# Patient Record
Sex: Male | Born: 1935
Health system: Southern US, Community
[De-identification: ages and names within clinical notes are randomized; demographics above are authoritative.]

## PROBLEM LIST (undated history)

## (undated) DIAGNOSIS — R339 Retention of urine, unspecified: Secondary | ICD-10-CM

## (undated) DIAGNOSIS — N289 Disorder of kidney and ureter, unspecified: Secondary | ICD-10-CM

## (undated) HISTORY — DX: Retention of urine, unspecified: R33.9

---

## 2002-01-28 HISTORY — PX: COLONOSCOPY: SHX5424

## 2011-07-06 ENCOUNTER — Emergency Department: Payer: Self-pay | Admitting: *Deleted

## 2011-07-06 DIAGNOSIS — S61209A Unspecified open wound of unspecified finger without damage to nail, initial encounter: Secondary | ICD-10-CM | POA: Diagnosis not present

## 2011-07-16 ENCOUNTER — Emergency Department: Payer: Self-pay | Admitting: Emergency Medicine

## 2011-12-17 DIAGNOSIS — Z23 Encounter for immunization: Secondary | ICD-10-CM | POA: Diagnosis not present

## 2011-12-31 DIAGNOSIS — L259 Unspecified contact dermatitis, unspecified cause: Secondary | ICD-10-CM | POA: Diagnosis not present

## 2011-12-31 DIAGNOSIS — L280 Lichen simplex chronicus: Secondary | ICD-10-CM | POA: Diagnosis not present

## 2012-02-11 DIAGNOSIS — L738 Other specified follicular disorders: Secondary | ICD-10-CM | POA: Diagnosis not present

## 2012-02-11 DIAGNOSIS — L259 Unspecified contact dermatitis, unspecified cause: Secondary | ICD-10-CM | POA: Diagnosis not present

## 2012-02-11 DIAGNOSIS — L821 Other seborrheic keratosis: Secondary | ICD-10-CM | POA: Diagnosis not present

## 2012-05-19 DIAGNOSIS — M533 Sacrococcygeal disorders, not elsewhere classified: Secondary | ICD-10-CM | POA: Diagnosis not present

## 2012-05-19 DIAGNOSIS — H6120 Impacted cerumen, unspecified ear: Secondary | ICD-10-CM | POA: Diagnosis not present

## 2012-10-27 DIAGNOSIS — Z23 Encounter for immunization: Secondary | ICD-10-CM | POA: Diagnosis not present

## 2014-10-16 ENCOUNTER — Emergency Department
Admission: EM | Admit: 2014-10-16 | Discharge: 2014-10-16 | Disposition: A | Discharge status: Home / Self Care | Payer: Medicare Other | Attending: Emergency Medicine | Admitting: Emergency Medicine

## 2014-10-16 ENCOUNTER — Encounter: Payer: Self-pay | Admitting: *Deleted

## 2014-10-16 DIAGNOSIS — Z720 Tobacco use: Secondary | ICD-10-CM | POA: Diagnosis not present

## 2014-10-16 DIAGNOSIS — R339 Retention of urine, unspecified: Secondary | ICD-10-CM | POA: Diagnosis not present

## 2014-10-16 LAB — URINALYSIS COMPLETE WITH MICROSCOPIC (ARMC ONLY)
Bacteria, UA: NONE SEEN
Bilirubin Urine: NEGATIVE
Glucose, UA: NEGATIVE mg/dL
KETONES UR: NEGATIVE mg/dL
LEUKOCYTES UA: NEGATIVE
Nitrite: NEGATIVE
PH: 5 (ref 5.0–8.0)
PROTEIN: NEGATIVE mg/dL
SPECIFIC GRAVITY, URINE: 1.004 — AB (ref 1.005–1.030)
SQUAMOUS EPITHELIAL / LPF: NONE SEEN

## 2014-10-16 NOTE — Discharge Instructions (Signed)
Please seek medical attention for any high fevers, chest pain, shortness of breath, change in behavior, persistent vomiting, bloody stool or any other new or concerning symptoms. ° °Acute Urinary Retention °Acute urinary retention is the temporary inability to urinate. °This is a common problem in older men. As men age their prostates become larger and block the flow of urine from the bladder. This is usually a problem that has come on gradually.  °HOME CARE INSTRUCTIONS °If you are sent home with a Foley catheter and a drainage system, you will need to discuss the best course of action with your health care provider. While the catheter is in, maintain a good intake of fluids. Keep the drainage bag emptied and lower than your catheter. This is so that contaminated urine will not flow back into your bladder, which could lead to a urinary tract infection. °There are two main types of drainage bags. One is a large bag that usually is used at night. It has a good capacity that will allow you to sleep through the night without having to empty it. The second type is called a leg bag. It has a smaller capacity, so it needs to be emptied more frequently. However, the main advantage is that it can be attached by a leg strap and can go underneath your clothing, allowing you the freedom to move about or leave your home. °Only take over-the-counter or prescription medicines for pain, discomfort, or fever as directed by your health care provider.  °SEEK MEDICAL CARE IF: °· You develop a low-grade fever. °· You experience spasms or leakage of urine with the spasms. °SEEK IMMEDIATE MEDICAL CARE IF:  °· You develop chills or fever. °· Your catheter stops draining urine. °· Your catheter falls out. °· You start to develop increased bleeding that does not respond to rest and increased fluid intake. °MAKE SURE YOU: °· Understand these instructions. °· Will watch your condition. °· Will get help right away if you are not doing well or  get worse. °Document Released: 04/22/2000 Document Revised: 01/19/2013 Document Reviewed: 06/25/2012 °ExitCare® Patient Information ©2015 [employer]. This information is not intended to replace advice given to you by your health care provider. Make sure you discuss any questions you have with your health care provider. ° °

## 2014-10-16 NOTE — ED Provider Notes (Signed)
[facility] Emergency Department Provider Note   ____________________________________________  Time seen: 1830  I have reviewed the triage vital signs and the nursing notes.   HISTORY  Chief Complaint Urinary Retention   History limited by:  Language barrier   HPI Norman Waters is a 81 y.o. male  Who presents to the emergency department today with complaints of urinary retention. The patient states he was able to urinate a very small amount this morning. he has since developed lower abdominal swelling and pain. He denies any fevers, nausea or vomiting. He states he has had urinary retention in the past. He is unclear the etiology of the retention however states they told him there was a blockage. He is on a medication for his bladder that was prescribed in [place], he is unclear of its name.    History reviewed. No pertinent past medical history.  There are no active problems to display for this patient.   History reviewed. No pertinent past surgical history.  No current outpatient prescriptions on file.  Allergies Review of patient's allergies indicates no known allergies.  History reviewed. No pertinent family history.  Social History Social History  Substance Use Topics  . Smoking status: Current Every Day Smoker  . Smokeless tobacco: None  . Alcohol Use: None    Review of Systems  Constitutional: Negative for fever. Cardiovascular: Negative for chest pain. Respiratory: Negative for shortness of breath. Gastrointestinal: positive for lower abdominal pain Genitourinary:  Positive for urinary retention Musculoskeletal: Negative for back pain. Skin: Negative for rash. Neurological: Negative for headaches, focal weakness or numbness.   10-point ROS otherwise negative.  ____________________________________________   PHYSICAL EXAM:  VITAL SIGNS: ED Triage Vitals  Enc Vitals Group     BP 10/16/14 1755 185/73 mmHg     Pulse Rate  10/16/14 1755 90     Resp 10/16/14 1755 20     Temp 10/16/14 1755 97.8 F (36.6 C)     Temp Source 10/16/14 1755 Oral     SpO2 10/16/14 1755 97 %     Weight 10/16/14 1755 150 lb (68.04 kg)     Height 10/16/14 1755  (1.727 m)     Head Cir --      Peak Flow --      Pain Score 10/16/14 1756 9   Constitutional: Alert and oriented. Well appearing and in no distress. Eyes: Conjunctivae are normal. PERRL. Normal extraocular movements. ENT   Head: Normocephalic and atraumatic.   Nose: No congestion/rhinnorhea.   Mouth/Throat: Mucous membranes are moist.   Neck: No stridor. Hematological/Lymphatic/Immunilogical: No cervical lymphadenopathy. Cardiovascular: Normal rate, regular rhythm.  No murmurs, rubs, or gallops. Respiratory: Normal respiratory effort without tachypnea nor retractions. Breath sounds are clear and equal bilaterally. No wheezes/rales/rhonchi. Gastrointestinal: Soft.  Distended in the lower abdomen. Tender. Genitourinary: Deferred Musculoskeletal: Normal range of motion in all extremities. No joint effusions.  No lower extremity tenderness nor edema. Neurologic:  Normal speech and language. No gross focal neurologic deficits are appreciated. Speech is normal.  Skin:  Skin is warm, dry and intact. No rash noted. Psychiatric: Mood and affect are normal. Speech and behavior are normal. Patient exhibits appropriate insight and judgment.  ____________________________________________    LABS (pertinent positives/negatives)  Labs Reviewed  URINALYSIS COMPLETEWITH MICROSCOPIC ([facility] ONLY) - Abnormal; Notable for the following:    Color, Urine STRAW (*)    APPearance CLEAR (*)    Specific Gravity, Urine 1.004 (*)    Hgb urine dipstick 1+ (*)  All other components within normal limits     ____________________________________________   EKG  None  ____________________________________________     RADIOLOGY  None  ____________________________________________   PROCEDURES  Procedure(s) performed: None  Critical Care performed: No  ____________________________________________   INITIAL IMPRESSION / ASSESSMENT AND PLAN / ED COURSE  Pertinent labs & imaging results that were available during my care of the patient were reviewed by me and considered in my medical decision making (see chart for details).   patient presented to the emergency department today with concerns for urinary retention and abdominal pain. Patient did have bladder scan of 852 mL. His physical exam was consistent with some bladder distention. A urinary catheter was placed with good urine output and relief of pain. I discussed with patient and family necessity of following up with urology. Urine was tested is not showing any signs of infection currently.  ____________________________________________   FINAL CLINICAL IMPRESSION(S) / ED DIAGNOSES  Final diagnoses:  Urinary retention     Phineas Semen, MD 10/16/14 2021

## 2014-10-16 NOTE — ED Notes (Signed)
Bladder scan 852

## 2014-10-16 NOTE — ED Notes (Signed)
Pt speaks mandarin, grandson at bedside to translate, states urinary retention and lower abd pain, states last time he urinated was this AM, hx of urinary retention

## 2014-10-18 ENCOUNTER — Encounter: Payer: Self-pay | Admitting: Obstetrics and Gynecology

## 2014-10-18 ENCOUNTER — Ambulatory Visit (INDEPENDENT_AMBULATORY_CARE_PROVIDER_SITE_OTHER): Payer: Medicare Other | Admitting: Obstetrics and Gynecology

## 2014-10-18 VITALS — BP 118/69 | HR 69 | Ht 69.75 in | Wt 159.6 lb

## 2014-10-18 DIAGNOSIS — R339 Retention of urine, unspecified: Secondary | ICD-10-CM

## 2014-10-18 NOTE — Progress Notes (Signed)
10/18/2014 5:19 PM   Epic Norman Waters 1931-08-11 [id number]  Referring Norman Waters: No referring [name] defined for this encounter.  Chief Complaint  Patient presents with  . Urinary Retention    new pt, [facility] ER referral x 2 days ago. Pt son states this happen before he was seen in the hospital [place] x ago in [place] country and put on Rapaflo x .     HPI: Patient is an 81 year old [place] male presenting today with his son for follow-up after being seen in the emergency department 2 days ago for acute urinary retention.  A Foley catheter was placed in the emergency department with 900 mL's residual. Patient does not speak [place] and son is providing history.   He states that patient does have a history of urinary retention and was treated in [place] with Rapaflo 4 mg twice daily. He was recently told to reduce his dosage by half due to GI upset a few weeks ago. He states that he has been taking this medication for over a year now and it was well tolerated until recently. His urinary symptoms were significantly improved while taking medication. Son states that at one time patient's physician in [place] was considering a surgery but that he was told recently that he no longer needed it. Since urinary retention episode patient has started taking his Rapaflo twice daily again and is tolerating it well/  Patient's urinary complaints include difficulty urinating, urgency and nocturia 3-4 times per night. No dysuria or gross hematuria.  No family history of prostate cancers.  Smoking history light smoker 10-15 years.  PCP: Bari Edward, MD    PMH: No past medical history on file.  Surgical History: Past Surgical History  Procedure Laterality Date  . No past surgeries      Home Medications:    Medication List       This list is accurate as of: 10/18/14  5:19 PM.  Always use your most recent med list.               silodosin 4 MG Caps capsule  Commonly known as:   RAPAFLO  Take 4 mg by mouth daily with breakfast.        Allergies: No Known Allergies  Family History: Family History  Problem Relation Age of Onset  . Prostate cancer Neg Hx   . Bladder Cancer Neg Hx   . Kidney cancer Neg Hx     Social History:  reports that he has been smoking Cigarettes.  He has been smoking about 0.25 packs per day. He does not have any smokeless tobacco history on file. He reports that he does not drink alcohol or use illicit drugs.  ROS: UROLOGY Frequent Urination?: No Hard to postpone urination?: Yes Burning/pain with urination?: No Get up at night to urinate?: Yes Leakage of urine?: No Urine stream starts and stops?: No Trouble starting stream?: No Do you have to strain to urinate?: No Blood in urine?: No Urinary tract infection?: No Sexually transmitted disease?: No Injury to kidneys or bladder?: No Weak stream?: No Erection problems?: No Penile pain?: No  Gastrointestinal Nausea?: Yes Vomiting?: No Indigestion/heartburn?: No Diarrhea?: No Constipation?: No  Constitutional Fever: No Night sweats?: No Weight loss?: No Fatigue?: No  Skin Skin rash/lesions?: No Itching?: Yes  Eyes Blurred vision?: No Double vision?: No  Ears/Nose/Throat Sore throat?: No Sinus problems?: No  Hematologic/Lymphatic Swollen glands?: No Easy bruising?: No  Cardiovascular Leg swelling?: No Chest pain?: No  Respiratory Cough?: Yes  Shortness of breath?: No  Endocrine Excessive thirst?: No  Musculoskeletal Back pain?: No Joint pain?: No  Neurological Headaches?: No Dizziness?: No  Psychologic Depression?: No Anxiety?: No  Physical Exam: BP 118/69 mmHg  Pulse 69  Ht 5' 9.75" (1.772 m)  Wt 159 lb 9.6 oz (72.394 kg)  BMI 23.06 kg/m2  Constitutional:  Alert and oriented, No acute distress. HEENT: [place] AT, moist mucus membranes.  Trachea midline, no masses. Cardiovascular: No clubbing, cyanosis, or edema. Respiratory: Normal  respiratory effort, no increased work of breathing. GI: Abdomen is soft, nontender, nondistended, no abdominal masses GU: No CVA tenderness.  Skin: No rashes, bruises or suspicious lesions. Lymph: No cervical or inguinal adenopathy. Neurologic: Grossly intact, no focal deficits, moving all 4 extremities. Psychiatric: Normal mood and affect.  Laboratory Data: No results found for: WBC, HGB, HCT, MCV, PLT  No results found for: CREATININE  No results found for: PSA  No results found for: TESTOSTERONE  No results found for: HGBA1C  Urinalysis    Component Value Date/Time   COLORURINE STRAW* 10/16/2014 1918   APPEARANCEUR CLEAR* 10/16/2014 1918   LABSPEC 1.004* 10/16/2014 1918   PHURINE 5.0 10/16/2014 1918   GLUCOSEU NEGATIVE 10/16/2014 1918   HGBUR 1+* 10/16/2014 1918   BILIRUBINUR NEGATIVE 10/16/2014 1918   KETONESUR NEGATIVE 10/16/2014 1918   PROTEINUR NEGATIVE 10/16/2014 1918   NITRITE NEGATIVE 10/16/2014 1918   LEUKOCYTESUR NEGATIVE 10/16/2014 1918    Pertinent Imaging:   Assessment & Plan: A 81 year old male with acute urinary retention most likely related to BPH. Previous healthcare obtained in [place].   1. Urinary retention- Episode of urinary retention most likely due BPH and patient reduction in Rapaflo dosage. We'll plan to leave Foley catheter in place for 1 week and have patient continue Rapaflo and return for voiding trial. It is unclear what type of surgery previous healthcare providers were considering for patient. After speaking to patient's son I feel that they may have been recommending a prostate biopsy vs bladder outlet procedure? DRE and PSA to be drawn at next visit. We also may need to consider starting finasteride in addition to Rapaflo. I have requested that son obtain medical records if available.  Return in about 1 week (around 10/25/2014) for voiding trial; DRE; need mandarin interpreter at visits if possible.  [name], FNP  [facility] [address], [address] [place], [place] [address] [phone]

## 2014-10-21 ENCOUNTER — Encounter: Payer: Self-pay | Admitting: Internal Medicine

## 2014-10-25 ENCOUNTER — Ambulatory Visit
Admission: RE | Admit: 2014-10-25 | Discharge: 2014-10-25 | Disposition: A | Discharge status: Home / Self Care | Payer: Medicare Other | Source: Ambulatory Visit | Attending: Internal Medicine | Admitting: Internal Medicine

## 2014-10-25 ENCOUNTER — Encounter: Payer: Self-pay | Admitting: Obstetrics and Gynecology

## 2014-10-25 ENCOUNTER — Ambulatory Visit (INDEPENDENT_AMBULATORY_CARE_PROVIDER_SITE_OTHER): Payer: Medicare Other | Admitting: Internal Medicine

## 2014-10-25 ENCOUNTER — Encounter: Payer: Self-pay | Admitting: Internal Medicine

## 2014-10-25 ENCOUNTER — Ambulatory Visit (INDEPENDENT_AMBULATORY_CARE_PROVIDER_SITE_OTHER): Payer: Medicare Other | Admitting: Obstetrics and Gynecology

## 2014-10-25 VITALS — BP 124/64 | HR 66 | Ht 69.75 in | Wt 163.2 lb

## 2014-10-25 VITALS — BP 122/62 | HR 64 | Ht 69.75 in | Wt 161.0 lb

## 2014-10-25 DIAGNOSIS — I700 Atherosclerosis of aorta: Secondary | ICD-10-CM | POA: Insufficient documentation

## 2014-10-25 DIAGNOSIS — N400 Enlarged prostate without lower urinary tract symptoms: Secondary | ICD-10-CM | POA: Diagnosis not present

## 2014-10-25 DIAGNOSIS — R0602 Shortness of breath: Secondary | ICD-10-CM | POA: Insufficient documentation

## 2014-10-25 DIAGNOSIS — Z720 Tobacco use: Secondary | ICD-10-CM

## 2014-10-25 DIAGNOSIS — R918 Other nonspecific abnormal finding of lung field: Secondary | ICD-10-CM | POA: Diagnosis not present

## 2014-10-25 DIAGNOSIS — N401 Enlarged prostate with lower urinary tract symptoms: Secondary | ICD-10-CM

## 2014-10-25 DIAGNOSIS — R338 Other retention of urine: Secondary | ICD-10-CM

## 2014-10-25 DIAGNOSIS — N403 Nodular prostate with lower urinary tract symptoms: Secondary | ICD-10-CM | POA: Diagnosis not present

## 2014-10-25 DIAGNOSIS — R05 Cough: Secondary | ICD-10-CM | POA: Diagnosis not present

## 2014-10-25 DIAGNOSIS — F17210 Nicotine dependence, cigarettes, uncomplicated: Secondary | ICD-10-CM | POA: Insufficient documentation

## 2014-10-25 DIAGNOSIS — F17200 Nicotine dependence, unspecified, uncomplicated: Secondary | ICD-10-CM

## 2014-10-25 DIAGNOSIS — R339 Retention of urine, unspecified: Secondary | ICD-10-CM

## 2014-10-25 DIAGNOSIS — N138 Other obstructive and reflux uropathy: Secondary | ICD-10-CM | POA: Insufficient documentation

## 2014-10-25 MED ORDER — FINASTERIDE 5 MG PO TABS: 5.0000 mg | ORAL_TABLET | Freq: Every day | ORAL | Status: DC

## 2014-10-25 NOTE — Progress Notes (Signed)
Date:  10/25/2014   Name:  Norman Waters   DOB:  1931/07/17   MRN:  [id number] Norman Waters is an 81 year old [place] gentleman who speaks primarily Mandarin [place] and very little [place]. Telephone interpreter services are used with a reference ID [id number]. His son is present in the room during the exam.  Chief Complaint: No chief complaint on file. Shortness of Breath This is a new problem. The current episode started more than 1 month ago. The problem occurs daily. The problem has been unchanged. Pertinent negatives include no chest pain, headaches, leg pain, leg swelling or wheezing. The symptoms are aggravated by exercise. Risk factors include smoking. He has tried nothing for the symptoms.   complains of a dry cough. He denies wheezing or chest pains. No fever chills or change in appetite. He continues to smoke cigarettes but believes that he could quit if he tried.   Review of Systems:  Review of Systems  Constitutional: Negative for chills and appetite change.  Respiratory: Positive for cough and shortness of breath. Negative for choking, chest tightness and wheezing.   Cardiovascular: Negative for chest pain and leg swelling.  Genitourinary: Positive for difficulty urinating (recently seen by urology and started on a new medication).  Musculoskeletal: Negative for gait problem.  Neurological: Negative for dizziness, weakness and headaches.    Patient Active Problem List   Diagnosis Date Noted  . BPH with obstruction/lower urinary tract symptoms 10/25/2014  . Tobacco use disorder 10/25/2014    Prior to Admission medications   Medication Sig Start Date End Date Taking? Authorizing Provider  finasteride (PROSCAR) 5 MG tablet Take 1 tablet (5 mg total) by mouth daily. 10/25/14  Yes Fernanda Drum, FNP  silodosin (RAPAFLO) 4 MG CAPS capsule Take 4 mg by mouth daily with breakfast.   Yes Historical Provider, MD    No Known Allergies  Past Surgical History  Procedure Laterality Date   . No past surgeries    . Colonoscopy  2004    Social History  Substance Use Topics  . Smoking status: Current Every Day Smoker -- 0.25 packs/day    Types: Cigarettes  . Smokeless tobacco: None  . Alcohol Use: No     Medication list has been reviewed and updated.  Physical Examination:  Physical Exam  Constitutional: He is oriented to person, place, and time. He appears well-developed. No distress.  HENT:  Head: Normocephalic and atraumatic.  Eyes: Conjunctivae are normal. Right eye exhibits no discharge. Left eye exhibits no discharge. No scleral icterus.  Neck: Normal range of motion. Neck supple. No thyromegaly present.  Cardiovascular: Normal rate, regular rhythm and normal heart sounds.   Pulmonary/Chest: Effort normal. No respiratory distress. He has decreased breath sounds in the right lower field and the left lower field. He has no wheezes. He has no rhonchi.  Abdominal: Soft. There is no tenderness.  Musculoskeletal: Normal range of motion.  Neurological: He is alert and oriented to person, place, and time.  Skin: Skin is warm and dry. No rash noted.  Psychiatric: He has a normal mood and affect. His speech is normal and behavior is normal.  Nursing note and vitals reviewed.   BP 122/62 mmHg  Pulse 64  Ht 5' 9.75" (1.772 m)  Wt 161 lb (73.029 kg)  BMI 23.26 kg/m2  Assessment and Plan: 1. Tobacco use disorder Recommend the patient quit smoking if at all possible Review of chest x-ray shows some apical pleural thickening but no info infiltrate or COPD  we will repeat chest x-ray in 6 months to document stability - DG Chest 2 View; Future  2. Shortness of breath  begin Anoro  1 inhalation daily sample given Call for prescription if helpful - DG Chest 2 View; Future   Bari Edward, MD [facility]  10/25/2014

## 2014-10-25 NOTE — Progress Notes (Signed)
10/25/2014 10:51 AM   Norman Waters 24-Oct-1931 [id number]  Referring provider: No referring provider defined for this encounter.  Chief Complaint  Patient presents with  . Urinary Retention    1 week f/u     HPI: Patient presents today for voiding trial after acute urinary retention episode 1 week ago and DRE. He reports he has been feeling well without complaints.  Previous Complaint History: Patient is an 81 year old [place] male presenting today with his son for follow-up after being seen in the emergency department 2 days ago for acute urinary retention. A Foley catheter was placed in the emergency department with 900 mL's residual. Patient does not speak [place] and son is providing history.   He states that patient does have a history of urinary retention and was treated in [place] with Rapaflo 4 mg twice daily. He was recently told to reduce his dosage by half due to GI upset a few weeks ago. He states that he has been taking this medication for over a year now and it was well tolerated until recently. His urinary symptoms were significantly improved while taking medication. Son states that at one time patient's physician in [place] was considering a surgery but that he was told recently that he no longer needed it. Since urinary retention episode patient has started taking his Rapaflo twice daily again and is tolerating it well.  Patient's urinary complaints include difficulty urinating, urgency and nocturia 3-4 times per night. No dysuria or gross hematuria.  No family history of prostate cancers. Smoking history light smoker 10-15 years.  PCP: Norman Edward, MD    PMH: Past Medical History  Diagnosis Date  . Urinary retention     Surgical History: Past Surgical History  Procedure Laterality Date  . No past surgeries    . Colonoscopy  2004    Home Medications:    Medication List       This list is accurate as of: 10/25/14 10:51 AM.  Always use your most recent  med list.               finasteride 5 MG tablet  Commonly known as:  PROSCAR  Take 1 tablet (5 mg total) by mouth daily.     silodosin 4 MG Caps capsule  Commonly known as:  RAPAFLO  Take 4 mg by mouth daily with breakfast.        Allergies: No Known Allergies  Family History: Family History  Problem Relation Age of Onset  . Prostate cancer Neg Hx   . Bladder Cancer Neg Hx   . Kidney cancer Neg Hx     Social History:  reports that he has been smoking Cigarettes.  He has been smoking about 0.25 packs per day. He does not have any smokeless tobacco history on file. He reports that he does not drink alcohol or use illicit drugs.  ROS: UROLOGY Frequent Urination?: No Hard to postpone urination?: No Burning/pain with urination?: No Get up at night to urinate?: No Leakage of urine?: No Urine stream starts and stops?: No Trouble starting stream?: No Do you have to strain to urinate?: No Blood in urine?: No Urinary tract infection?: No Sexually transmitted disease?: No Injury to kidneys or bladder?: No Painful intercourse?: No Weak stream?: No Erection problems?: No Penile pain?: No  Gastrointestinal Nausea?: No Vomiting?: No Indigestion/heartburn?: No Diarrhea?: No Constipation?: No  Constitutional Fever: No Night sweats?: No Weight loss?: No Fatigue?: No  Skin Skin rash/lesions?: No Itching?: No  Eyes  Blurred vision?: No Double vision?: No  Ears/Nose/Throat Sore throat?: No Sinus problems?: No  Hematologic/Lymphatic Swollen glands?: No Easy bruising?: No  Cardiovascular Leg swelling?: No Chest pain?: No  Respiratory Cough?: No Shortness of breath?: Yes  Endocrine Excessive thirst?: No  Musculoskeletal Back pain?: No Joint pain?: No  Neurological Headaches?: No Dizziness?: No  Psychologic Depression?: No Anxiety?: No  Physical Exam: BP 124/64 mmHg  Pulse 66  Ht 5' 9.75" (1.772 m)  Wt 163 lb 3.2 oz (74.027 kg)  BMI 23.58  kg/m2  Constitutional:  Alert and oriented, No acute distress. HEENT: Norman Waters AT, moist mucus membranes.  Trachea midline, no masses. Cardiovascular: No clubbing, cyanosis, or edema. Respiratory: Normal respiratory effort, no increased work of breathing. DRE: good rectal tone, very enlarged prostate +4 size, small soft mobile nodule palpable mid prostate Skin: No rashes, bruises or suspicious lesions. Lymph: No cervical or inguinal adenopathy. Neurologic: Grossly intact, no focal deficits, moving all 4 extremities. Psychiatric: Normal mood and affect.  Laboratory Data: No results found for: WBC, HGB, HCT, MCV, PLT  No results found for: CREATININE  No results found for: PSA  No results found for: TESTOSTERONE  No results found for: HGBA1C  Urinalysis    Component Value Date/Time   COLORURINE STRAW* 10/16/2014 1918   APPEARANCEUR CLEAR* 10/16/2014 1918   LABSPEC 1.004* 10/16/2014 1918   PHURINE 5.0 10/16/2014 1918   GLUCOSEU NEGATIVE 10/16/2014 1918   HGBUR 1+* 10/16/2014 1918   BILIRUBINUR NEGATIVE 10/16/2014 1918   KETONESUR NEGATIVE 10/16/2014 1918   PROTEINUR NEGATIVE 10/16/2014 1918   NITRITE NEGATIVE 10/16/2014 1918   LEUKOCYTESUR NEGATIVE 10/16/2014 1918    Pertinent Imaging:   Assessment & Plan: A 81 year old male with acute urinary retention related to BPH. Previous healthcare obtained in [place].   1. Urinary retention- Successful voiding trial today. Will add finasteride to Rapaflo to reduce chances of recurrent retention in the future.  2. BPH with LUTS-  Patient with a significantly enlarged prostate on DRE with a small soft mobile nodule mid prostate.  I discussed patient case with Dr. Berneice Heinrich who also performed DRE.  Given patient's age and health status we recommended follow up in 6 months for further monitoring of prostate nodule with DRE. Per Dr. Berneice Heinrich patient patient does not need PSA screening.  There are no diagnoses linked to this encounter.  Return in  about 6 months (around 04/24/2015) for DRE/PVR recheck urinary symptoms.  [name], FNP  [facility] [address] [place], [place] [address] [phone]

## 2014-10-25 NOTE — Progress Notes (Signed)
Fill and Pull Catheter Removal  Patient is present today for a catheter removal.  Patient was cleaned and prepped in a sterile fashion 210 ml of sterile water/ saline was instilled into the bladder when the patient felt the urge to urinate. 9 ml of water was then drained from the balloon.  A 16 FR foley cath was removed from the bladder no complications were noted .  Patient as then given some time to void on their own.  Patient can void  200 ml on their own after some time.  Patient tolerated well.  Preformed by: Natividad Brood, CMA

## 2014-11-01 DIAGNOSIS — Z23 Encounter for immunization: Secondary | ICD-10-CM | POA: Diagnosis not present

## 2014-11-08 ENCOUNTER — Other Ambulatory Visit: Payer: Self-pay | Admitting: Internal Medicine

## 2014-11-08 MED ORDER — UMECLIDINIUM-VILANTEROL 62.5-25 MCG/INH IN AEPB: 1.0000 | INHALATION_SPRAY | Freq: Every day | RESPIRATORY_TRACT | Status: DC

## 2014-11-15 ENCOUNTER — Other Ambulatory Visit: Payer: Self-pay | Admitting: Internal Medicine

## 2014-11-15 ENCOUNTER — Telehealth: Payer: Self-pay

## 2014-11-15 NOTE — Telephone Encounter (Signed)
done

## 2014-11-21 ENCOUNTER — Other Ambulatory Visit: Payer: Self-pay | Admitting: Internal Medicine

## 2014-11-21 DIAGNOSIS — J449 Chronic obstructive pulmonary disease, unspecified: Secondary | ICD-10-CM

## 2014-11-21 MED ORDER — SALMETEROL XINAFOATE 50 MCG/DOSE IN AEPB: 1.0000 | INHALATION_SPRAY | Freq: Every day | RESPIRATORY_TRACT | Status: DC

## 2014-11-21 MED ORDER — TIOTROPIUM BROMIDE MONOHYDRATE 18 MCG IN CAPS: 18.0000 ug | ORAL_CAPSULE | Freq: Every day | RESPIRATORY_TRACT | Status: DC

## 2014-12-26 ENCOUNTER — Encounter: Payer: Self-pay | Admitting: Obstetrics and Gynecology

## 2014-12-26 ENCOUNTER — Ambulatory Visit (INDEPENDENT_AMBULATORY_CARE_PROVIDER_SITE_OTHER): Payer: Medicare Other | Admitting: Obstetrics and Gynecology

## 2014-12-26 VITALS — BP 126/73 | HR 79 | Resp 18 | Ht 68.0 in | Wt 160.6 lb

## 2014-12-26 DIAGNOSIS — R339 Retention of urine, unspecified: Secondary | ICD-10-CM

## 2014-12-26 LAB — BLADDER SCAN AMB NON-IMAGING: Scan Result: 107

## 2014-12-26 MED ORDER — SILODOSIN 4 MG PO CAPS: 4.0000 mg | ORAL_CAPSULE | Freq: Every day | ORAL | Status: DC

## 2014-12-26 NOTE — Progress Notes (Signed)
12/26/2014 9:00 AM   Norman Waters Norman Waters, Norman Waters [id number]  Referring provider: Reubin MilanLaura H Berglund, MD [address] [place], [place] [id number]  Chief Complaint  Patient presents with  . Urinary Retention    f/u    HPI: Patient is a 81 year old male with a history of COPD, BPH and urinary retention presenting today with his son. He is currently managed with a master eyes and Rapaflo. He reports no urinary issues. He denies frequency, hesitancy or sensation of incomplete bladder emptying. He was previously obtaining his [place] from [place] and presents today requesting a refill sent to his local pharmacy.  PMH: Past Medical History  Diagnosis Date  . Urinary retention     Surgical History: Past Surgical History  Procedure Laterality Date  . No past surgeries    . Colonoscopy  2004    Home Medications:    Medication List       This list is accurate as of: 12/26/14  9:00 AM.  Always use your most recent med list.               finasteride 5 MG tablet  Commonly known as:  PROSCAR  Take 1 tablet (5 mg total) by mouth daily.     salmeterol 50 MCG/DOSE diskus inhaler  Commonly known as:  SEREVENT  Inhale 1 puff into the lungs daily.     silodosin 4 MG Caps capsule  Commonly known as:  RAPAFLO  Take 4 mg by mouth daily with breakfast.     tiotropium 18 MCG inhalation capsule  Commonly known as:  SPIRIVA  Place 1 capsule (18 mcg total) into inhaler and inhale daily.        Allergies: No Known Allergies  Family History: Family History  Problem Relation Age of Onset  . Prostate cancer Neg Hx   . Bladder Cancer Neg Hx   . Kidney cancer Neg Hx     Social History:  reports that he has been smoking Cigarettes.  He has been smoking about 0.Waters packs per day. He does not have any smokeless tobacco history on file. He reports that he does not drink alcohol or use illicit drugs.  ROS: UROLOGY Frequent Urination?: No Hard to postpone urination?: No Burning/pain  with urination?: No Get up at night to urinate?: No Leakage of urine?: No Urine stream starts and stops?: No Trouble starting stream?: No Do you have to strain to urinate?: No Blood in urine?: No Urinary tract infection?: No Sexually transmitted disease?: No Injury to kidneys or bladder?: No Painful intercourse?: No Weak stream?: No Erection problems?: No Penile pain?: No  Gastrointestinal Nausea?: No Vomiting?: No Indigestion/heartburn?: No Diarrhea?: No Constipation?: No  Constitutional Fever: No Night sweats?: No Weight loss?: No Fatigue?: No  Skin Skin rash/lesions?: No Itching?: No  Eyes Blurred vision?: No Double vision?: No  Ears/Nose/Throat Sore throat?: No Sinus problems?: No  Hematologic/Lymphatic Swollen glands?: No Easy bruising?: No  Cardiovascular Leg swelling?: No Chest pain?: No  Respiratory Cough?: No Shortness of breath?: No  Endocrine Excessive thirst?: No  Musculoskeletal Back pain?: No Joint pain?: No  Neurological Headaches?: No Dizziness?: No  Psychologic Depression?: No Anxiety?: No  Physical Exam: BP 126/73 mmHg  Pulse 79  Resp 18  Ht 5\' 8"  (1.727 m)  Wt 160 lb 9.6 oz (72.848 kg)  BMI 24.42 kg/m2  Constitutional:  Alert and oriented, No acute distress. HEENT: [place] AT, moist mucus membranes.  Trachea midline, no masses. Cardiovascular: No clubbing, cyanosis, or edema.  Respiratory: Normal respiratory effort, no increased work of breathing. Skin: No rashes, bruises or suspicious lesions. Lymph: No cervical or inguinal adenopathy. Neurologic: Grossly intact, no focal deficits, moving all 4 extremities. Psychiatric: Normal mood and affect.  Laboratory Data:   Urinalysis Results for orders placed or performed in visit on 12/26/14  BLADDER SCAN AMB NON-IMAGING  Result Value Ref Range   Scan Result 107 mL     Pertinent Imaging:   Assessment & Plan:    1. BPH with LUTS- PVR . Refilled Rapaflo today.   Patient also to continue finasteride and in follow-up as scheduled. - BLADDER SCAN AMB NON-IMAGING   No Follow-up on file.  These notes generated with voice recognition software. I apologize for typographical errors.  [name], FNP  [facility] [address], [address] [place], [place] [address] [phone]

## 2015-04-13 ENCOUNTER — Telehealth: Payer: Self-pay | Admitting: Obstetrics and Gynecology

## 2015-04-13 DIAGNOSIS — N400 Enlarged prostate without lower urinary tract symptoms: Secondary | ICD-10-CM

## 2015-04-13 NOTE — Telephone Encounter (Signed)
Pt's son came in and said Lillia AbedLindsay had him taking 1 capsule per day with breakfast of Rapaflo 4 mg capsules.  He had 3 refills 90 per refill.  Pt is out of meds.  He has been taking 2 per day.  His dr. In [place] told him to take twice per day.  He said he told Lillia AbedLindsay this.  Can you get any refills?  He is completely out.

## 2015-04-14 MED ORDER — SILODOSIN 8 MG PO CAPS: 8.0000 mg | ORAL_CAPSULE | Freq: Every day | ORAL | Status: DC

## 2015-04-14 NOTE — Telephone Encounter (Signed)
Per Dr. Sherryl BartersBudzyn pt could have samples and refills. Pt was given 8mg  and told to take once a day. Son voiced understanding.

## 2015-04-25 ENCOUNTER — Ambulatory Visit (INDEPENDENT_AMBULATORY_CARE_PROVIDER_SITE_OTHER): Payer: Medicare Other | Admitting: Urology

## 2015-04-25 ENCOUNTER — Encounter: Payer: Self-pay | Admitting: Urology

## 2015-04-25 VITALS — BP 128/67 | HR 71 | Ht 68.0 in | Wt 158.6 lb

## 2015-04-25 DIAGNOSIS — N401 Enlarged prostate with lower urinary tract symptoms: Secondary | ICD-10-CM | POA: Diagnosis not present

## 2015-04-25 DIAGNOSIS — R339 Retention of urine, unspecified: Secondary | ICD-10-CM | POA: Diagnosis not present

## 2015-04-25 DIAGNOSIS — N138 Other obstructive and reflux uropathy: Secondary | ICD-10-CM

## 2015-04-25 LAB — BLADDER SCAN AMB NON-IMAGING: SCAN RESULT: 90

## 2015-04-25 MED ORDER — FINASTERIDE 5 MG PO TABS: 5.0000 mg | ORAL_TABLET | Freq: Every day | ORAL | Status: DC

## 2015-04-25 MED ORDER — TAMSULOSIN HCL 0.4 MG PO CAPS
ORAL_CAPSULE | ORAL | Status: DC
Start: 2015-04-25 — End: 2016-04-23

## 2015-04-25 NOTE — Progress Notes (Signed)
10:36 AM   Norman Waters 11-01-1931 [id number]  Referring provider: Reubin Milan, MD [address] [place], [place] [address]  Chief Complaint  Patient presents with  . Benign Prostatic Hypertrophy    6 month follow up  . Urinary Retention    HPI: Patient is an 82 year old Asian male who presents today for 6 month follow-up for BPH with L UTS and history of urinary retention which is currently managed with Rapaflo 8 mg daily and finasteride 5 mg daily  BPH WITH LUTS His IPSS score today is 3, which is mild lower urinary tract symptomatology. He is pleased with his quality life due to his urinary symptoms.  His PVR is 90 mL.   His major complaint today the Rapaflo is too strong.   He denies any dysuria, hematuria or suprapubic pain.   He currently taking Rapaflo 8 mg daily and finasteride 5 mg daily.  He also denies any recent fevers, chills, nausea or vomiting.  He does not have a family history of PCa.      IPSS      04/25/15 1000       International Prostate Symptom Score   How often have you had the sensation of not emptying your bladder? Not at All     How often have you had to urinate less than every two hours? Not at All     How often have you found you stopped and started again several times when you urinated? Not at All     How often have you found it difficult to postpone urination? Not at All     How often have you had a weak urinary stream? Not at All     How often have you had to strain to start urination? Not at All     How many times did you typically get up at night to urinate? 3 Times     Total IPSS Score 3     Quality of Life due to urinary symptoms   If you were to spend the rest of your life with your urinary condition just the way it is now how would you feel about that? Pleased        Score:  1-7 Mild 8-19 Moderate 20-35 Severe  Urinary retention Patient was seen at [facility] emergency room  with an episode of acute  urinary retention in September 2016.  A Foley catheter was placed with 500 mL residual.  He has since had a voiding trial and has been voiding well.  His PVR today is 90 mL.   PMH: Past Medical History  Diagnosis Date  . Urinary retention     Surgical History: Past Surgical History  Procedure Laterality Date  . Colonoscopy  2004    Home Medications:    Medication List       This list is accurate as of: 04/25/15 10:36 AM.  Always use your most recent med list.               finasteride 5 MG tablet  Commonly known as:  PROSCAR  Take 1 tablet (5 mg total) by mouth daily.     salmeterol 50 MCG/DOSE diskus inhaler  Commonly known as:  SEREVENT  Inhale 1 puff into the lungs daily.     tamsulosin 0.4 MG Caps capsule  Commonly known as:  FLOMAX  Take one capsule in the morning and one in the evening     tiotropium 18 MCG  inhalation capsule  Commonly known as:  SPIRIVA  Place 1 capsule (18 mcg total) into inhaler and inhale daily.        Allergies: No Known Allergies  Family History: Family History  Problem Relation Age of Onset  . Prostate cancer Neg Hx   . Bladder Cancer Neg Hx   . Kidney cancer Neg Hx     Social History:  reports that he has been smoking Cigarettes.  He has been smoking about 0.25 packs per day. He does not have any smokeless tobacco history on file. He reports that he does not drink alcohol or use illicit drugs.  ROS: UROLOGY Frequent Urination?: No Hard to postpone urination?: No Burning/pain with urination?: No Get up at night to urinate?: No Leakage of urine?: No Urine stream starts and stops?: No Trouble starting stream?: No Do you have to strain to urinate?: No Blood in urine?: No Urinary tract infection?: No Sexually transmitted disease?: No Injury to kidneys or bladder?: No Painful intercourse?: No Weak stream?: No Erection problems?: No Penile pain?: No  Gastrointestinal Nausea?: No Vomiting?: No Indigestion/heartburn?:  No Diarrhea?: No Constipation?: No  Constitutional Fever: No Night sweats?: No Weight loss?: No Fatigue?: No  Skin Skin rash/lesions?: No Itching?: Yes  Eyes Blurred vision?: No Double vision?: No  Ears/Nose/Throat Sore throat?: No Sinus problems?: No  Hematologic/Lymphatic Swollen glands?: No Easy bruising?: No  Cardiovascular Leg swelling?: No Chest pain?: No  Respiratory Cough?: No Shortness of breath?: No  Endocrine Excessive thirst?: No  Musculoskeletal Back pain?: No Joint pain?: No  Neurological Headaches?: No Dizziness?: No  Psychologic Depression?: No Anxiety?: No  Physical Exam: BP 128/67 mmHg  Pulse 71  Ht 5\' 8"  (1.727 m)  Wt 158 lb 9.6 oz (71.94 kg)  BMI 24.12 kg/m2  Constitutional: Well nourished. Alert and oriented, No acute distress. HEENT: [place] AT, moist mucus membranes. Trachea midline, no masses. Cardiovascular: No clubbing, cyanosis, or edema. Respiratory: Normal respiratory effort, no increased work of breathing. GI: Abdomen is soft, non tender, non distended, no abdominal masses. Liver and spleen not palpable.  No hernias appreciated.  Stool sample for occult testing is not indicated.   GU: No CVA tenderness.  No bladder fullness or masses.  Patient with circumcised phallus.  Urethral meatus is patent.  No penile discharge. No penile lesions or rashes. Scrotum without lesions, cysts, rashes and/or edema.  Testicles are located scrotally bilaterally. No masses are appreciated in the testicles. Left and right epididymis are normal. Rectal: Patient with  normal sphincter tone. Anus and perineum without scarring or rashes. No rectal masses are appreciated. Prostate is approximately 80 grams, no nodules are appreciated. Seminal vesicles are normal. Skin: No rashes, bruises or suspicious lesions. Lymph: No cervical or inguinal adenopathy. Neurologic: Grossly intact, no focal deficits, moving all 4 extremities. Psychiatric: Normal mood  and affect.    Pertinent Imaging: Results for [name], Izik (MRN [id number]) as of 04/25/2015 13:18  Ref. Range 04/25/2015 10:14  Scan Result Unknown 90    Assessment & Plan:    1. BPH with LUTS:   IPSS score is 3/1.   He will continue finasteride 5 mg daily and we will change his Rapaflo to tamsulosin 0.4 mg twice daily.  He will have his IPSS score and exam in 12 months.   - BLADDER SCAN AMB NON-IMAGING  2. Urinary retention:   Patient is voiding well.  His PVR was 90 mL.  We will continue to monitor.  He will RTC in one  year for a PVR.  He will contact the office if he experiences difficulty urinating.    Return in about 1 year (around 04/24/2016) for IPSS and exam.  These notes generated with voice recognition software. I apologize for typographical errors.  Michiel Cowboy, PA-C  [facility] [address], [address] [place], [place] [address] [phone]

## 2015-06-27 DIAGNOSIS — L821 Other seborrheic keratosis: Secondary | ICD-10-CM | POA: Diagnosis not present

## 2015-06-27 DIAGNOSIS — L853 Xerosis cutis: Secondary | ICD-10-CM | POA: Diagnosis not present

## 2015-06-27 DIAGNOSIS — L820 Inflamed seborrheic keratosis: Secondary | ICD-10-CM | POA: Diagnosis not present

## 2015-06-27 DIAGNOSIS — D692 Other nonthrombocytopenic purpura: Secondary | ICD-10-CM | POA: Diagnosis not present

## 2015-06-27 DIAGNOSIS — B359 Dermatophytosis, unspecified: Secondary | ICD-10-CM | POA: Diagnosis not present

## 2015-10-03 DIAGNOSIS — Z23 Encounter for immunization: Secondary | ICD-10-CM | POA: Diagnosis not present

## 2015-12-05 ENCOUNTER — Ambulatory Visit: Payer: Medicare Other | Admitting: Internal Medicine

## 2015-12-05 ENCOUNTER — Encounter: Payer: Self-pay | Admitting: Internal Medicine

## 2015-12-05 ENCOUNTER — Ambulatory Visit
Admission: RE | Admit: 2015-12-05 | Discharge: 2015-12-05 | Disposition: A | Discharge status: Home / Self Care | Payer: Medicare Other | Source: Ambulatory Visit | Attending: Internal Medicine | Admitting: Internal Medicine

## 2015-12-05 ENCOUNTER — Ambulatory Visit (INDEPENDENT_AMBULATORY_CARE_PROVIDER_SITE_OTHER): Payer: Medicare Other | Admitting: Internal Medicine

## 2015-12-05 VITALS — BP 122/58 | HR 69 | Temp 98.2°F | Resp 16 | Ht 68.0 in | Wt 159.0 lb

## 2015-12-05 DIAGNOSIS — R938 Abnormal findings on diagnostic imaging of other specified body structures: Secondary | ICD-10-CM | POA: Diagnosis not present

## 2015-12-05 DIAGNOSIS — F17200 Nicotine dependence, unspecified, uncomplicated: Secondary | ICD-10-CM | POA: Diagnosis not present

## 2015-12-05 DIAGNOSIS — R9389 Abnormal findings on diagnostic imaging of other specified body structures: Secondary | ICD-10-CM

## 2015-12-05 DIAGNOSIS — I700 Atherosclerosis of aorta: Secondary | ICD-10-CM | POA: Insufficient documentation

## 2015-12-05 DIAGNOSIS — J449 Chronic obstructive pulmonary disease, unspecified: Secondary | ICD-10-CM | POA: Insufficient documentation

## 2015-12-05 DIAGNOSIS — R05 Cough: Secondary | ICD-10-CM | POA: Diagnosis not present

## 2015-12-05 MED ORDER — AZITHROMYCIN 250 MG PO TABS: ORAL_TABLET | ORAL | 0 refills | Status: DC

## 2015-12-05 NOTE — Progress Notes (Signed)
Date:  12/05/2015   Name:  Norman Waters   DOB:  Aug 04, 1931   MRN:  [id number]  Patient speak [place] and Mandarin.  Video interpreter is used for Mandarin with son present.  Chief Complaint: Cough (2 weeks no fever) Cough  This is a new problem. The current episode started 1 to 4 weeks ago. The cough is productive of sputum. Associated symptoms include shortness of breath. Pertinent negatives include no chest pain, chills, ear pain, fever, heartburn, myalgias or wheezing. He has tried steroid inhaler for the symptoms.  He feels short of breath when walking.  Minimal sputum - clear.  No fever or chills.  No ear pain, nausea, diarrhea. He uses Anoro last year with good results but it was not on his insurance plan.  The others that he tried (advair and spiriva)did not help him. He continues to smoke about 5 cigs per day.    Review of Systems  Constitutional: Negative for chills, fatigue and fever.  HENT: Negative for ear discharge, ear pain and sinus pain.   Respiratory: Positive for cough and shortness of breath. Negative for chest tightness and wheezing.   Cardiovascular: Negative for chest pain.  Gastrointestinal: Negative for diarrhea, heartburn and nausea.  Musculoskeletal: Negative for myalgias.  Neurological: Negative for dizziness.    Patient Active Problem List   Diagnosis Date Noted  . Urinary retention 04/25/2015  . COPD, mild (HCC) 11/21/2014  . BPH with obstruction/lower urinary tract symptoms 10/25/2014  . Tobacco use disorder 10/25/2014    Prior to Admission medications   Medication Sig Start Date End Date Taking? Authorizing Provider  finasteride (PROSCAR) 5 MG tablet Take 1 tablet (5 mg total) by mouth daily. 04/25/15  Yes Harle BattiestShannon A McGowan, PA-C  tamsulosin (FLOMAX) 0.4 MG CAPS capsule Take one capsule in the morning and one in the evening 04/25/15  Yes Shannon A McGowan, PA-C  tiotropium (SPIRIVA) 18 MCG inhalation capsule Place 1 capsule (18 mcg total) into  inhaler and inhale daily. Patient not taking: Reported on 12/05/2015 11/21/14   Reubin MilanLaura H Gerlean Cid, MD    No Known Allergies  Past Surgical History:  Procedure Laterality Date  . COLONOSCOPY  2004    Social History  Substance Use Topics  . Smoking status: Current Every Day Smoker    Packs/day: 0.25    Types: Cigarettes  . Smokeless tobacco: Never Used  . Alcohol use No     Medication list has been reviewed and updated.   Physical Exam  Constitutional: He is oriented to person, place, and time. He appears well-developed. No distress.  HENT:  Head: Normocephalic and atraumatic.  Cardiovascular: Normal rate, regular rhythm and normal heart sounds.   Pulmonary/Chest: Effort normal. No respiratory distress. He has decreased breath sounds. He has no wheezes. He has no rhonchi.  Musculoskeletal: Normal range of motion.  Neurological: He is alert and oriented to person, place, and time.  Skin: Skin is warm and dry. No rash noted.  Psychiatric: He has a normal mood and affect.  Nursing note and vitals reviewed.   BP (!) 122/58   Pulse 69   Temp 98.2 F (36.8 C) (Oral)   Resp 16   Ht 5\' 8"  (1.727 m)   Wt 159 lb (72.1 kg)   SpO2 97%   BMI 24.18 kg/m   Assessment and Plan: 1. COPD, mild (HCC) Samples of Anoro (4) were given - azithromycin (ZITHROMAX Z-PAK) 250 MG tablet; 2 tabs on day #1 then 1 tab for  4 more days  Dispense: 6 each; Refill: 0  2. Abnormal CXR Repeat today - DG Chest 2 View; Future  3. Tobacco use disorder Encouraged him to continue to cut back on cigarettes   Bari EdwardLaura Tilia Faso, MD [facility]  12/05/2015

## 2015-12-05 NOTE — Patient Instructions (Signed)
Anoro  One puff daily (sample)

## 2015-12-25 ENCOUNTER — Telehealth: Payer: Self-pay

## 2015-12-25 ENCOUNTER — Other Ambulatory Visit: Payer: Self-pay | Admitting: Internal Medicine

## 2015-12-25 MED ORDER — UMECLIDINIUM-VILANTEROL 62.5-25 MCG/INH IN AEPB: 1.0000 | INHALATION_SPRAY | Freq: Every day | RESPIRATORY_TRACT | 5 refills | Status: DC

## 2015-12-25 NOTE — Telephone Encounter (Signed)
[facility] [place] Energ[facility]

## 2016-02-20 ENCOUNTER — Ambulatory Visit (INDEPENDENT_AMBULATORY_CARE_PROVIDER_SITE_OTHER): Payer: Medicare Other

## 2016-02-20 DIAGNOSIS — Z23 Encounter for immunization: Secondary | ICD-10-CM | POA: Diagnosis not present

## 2016-02-20 NOTE — Patient Instructions (Signed)
Pneumococcal Conjugate Vaccine (PCV13) What You Need to Know 1. Why get vaccinated? Vaccination can protect both children and adults from pneumococcal disease. Pneumococcal disease is caused by bacteria that can spread from person to person through close contact. It can cause ear infections, and it can also lead to more serious infections of the:  Lungs (pneumonia),  Blood (bacteremia), and  Covering of the brain and spinal cord (meningitis).  Pneumococcal pneumonia is most common among adults. Pneumococcal meningitis can cause deafness and brain damage, and it kills about 1 child in 10 who get it. Anyone can get pneumococcal disease, but children under 2 years of age and adults 65 years and older, people with certain medical conditions, and cigarette smokers are at the highest risk. Before there was a vaccine, the [place] saw:  more than 700 cases of meningitis,  about 13,000 blood infections,  about 5 million ear infections, and  about 200 deaths  in children under 5 each year from pneumococcal disease. Since vaccine became available, severe pneumococcal disease in these children has fallen by 88%. About 18,000 older adults die of pneumococcal disease each year in the [place]. Treatment of pneumococcal infections with penicillin and other drugs is not as effective as it used to be, because some strains of the disease have become resistant to these drugs. This makes prevention of the disease, through vaccination, even more important. 2. PCV13 vaccine Pneumococcal conjugate vaccine (called PCV13) protects against 13 types of pneumococcal bacteria. PCV13 is routinely given to children at 2, 4, 6, and 12-15 months of age. It is also recommended for children and adults 2 to 64 years of age with certain health conditions, and for all adults 65 years of age and older. Your doctor can give you details. 3. Some people should not get this vaccine Anyone who has ever had a  life-threatening allergic reaction to a dose of this vaccine, to an earlier pneumococcal vaccine called PCV7, or to any vaccine containing diphtheria toxoid (for example, DTaP), should not get PCV13. Anyone with a severe allergy to any component of PCV13 should not get the vaccine. Tell your doctor if the person being vaccinated has any severe allergies. If the person scheduled for vaccination is not feeling well, your healthcare provider might decide to reschedule the shot on another day. 4. Risks of a vaccine reaction With any medicine, including vaccines, there is a chance of reactions. These are usually mild and go away on their own, but serious reactions are also possible. Problems reported following PCV13 varied by age and dose in the series. The most common problems reported among children were:  About half became drowsy after the shot, had a temporary loss of appetite, or had redness or tenderness where the shot was given.  About 1 out of 3 had swelling where the shot was given.  About 1 out of 3 had a mild fever, and about 1 in 20 had a fever over 102.2F.  Up to about 8 out of 10 became fussy or irritable.  Adults have reported pain, redness, and swelling where the shot was given; also mild fever, fatigue, headache, chills, or muscle pain. Young children who get PCV13 along with inactivated flu vaccine at the same time may be at increased risk for seizures caused by fever. Ask your doctor for more information. Problems that could happen after any vaccine:  People sometimes faint after a medical procedure, including vaccination. Sitting or lying down for about 15 minutes can help prevent   fainting, and injuries caused by a fall. Tell your doctor if you feel dizzy, or have vision changes or ringing in the ears.  Some older children and adults get severe pain in the shoulder and have difficulty moving the arm where a shot was given. This happens very rarely.  Any medication can cause a  severe allergic reaction. Such reactions from a vaccine are very rare, estimated at about 1 in a million doses, and would happen within a few minutes to a few hours after the vaccination. As with any medicine, there is a very small chance of a vaccine causing a serious injury or death. The safety of vaccines is always being monitored. For more information, visit: www.cdc.gov/vaccinesafety/ 5. What if there is a serious reaction? What should I look for? Look for anything that concerns you, such as signs of a severe allergic reaction, very high fever, or unusual behavior. Signs of a severe allergic reaction can include hives, swelling of the face and throat, difficulty breathing, a fast heartbeat, dizziness, and weakness-usually within a few minutes to a few hours after the vaccination. What should I do?  If you think it is a severe allergic reaction or other emergency that can't wait, call 9-1-1 or get the person to the nearest hospital. Otherwise, call your doctor.  Reactions should be reported to the Vaccine Adverse Event Reporting System (VAERS). Your doctor should file this report, or you can do it yourself through the VAERS web site at www.vaers.hhs.gov, or by calling 1-800-822-7967. ? VAERS does not give medical advice. 6. The National Vaccine Injury Compensation Program The National Vaccine Injury Compensation Program (VICP) is a federal program that was created to compensate people who may have been injured by certain vaccines. Persons who believe they may have been injured by a vaccine can learn about the program and about filing a claim by calling 1-800-338-2382 or visiting the VICP website at www.hrsa.gov/vaccinecompensation. There is a time limit to file a claim for compensation. 7. How can I learn more?  Ask your healthcare provider. He or she can give you the vaccine package insert or suggest other sources of information.  Call your local or state health department.  Contact the  Centers for Disease Control and Prevention (CDC): ? Call 1-800-232-4636 (1-800-CDC-INFO) or ? Visit CDC's website at www.cdc.gov/vaccines Vaccine Information Statement, PCV13 Vaccine (12/02/2013) This information is not intended to replace advice given to you by your health care provider. Make sure you discuss any questions you have with your health care provider. Document Released: 11/11/2005 Document Revised: 10/05/2015 Document Reviewed: 10/05/2015 Elsevier Interactive Patient Education  2017 [employer]  

## 2016-04-22 NOTE — Progress Notes (Signed)
9:37 AM   Norman Waters Dec 20, 1931 [id number]  Referring provider: Reubin Milan, MD [address] [place], [place] [address]  Chief Complaint  Patient presents with  . Benign Prostatic Hypertrophy    1 year follow up  . Urinary Retention    HPI: Patient is an 83 year old Asian male who presents today for one year follow up for BPH with L UTS and history of urinary retention which is currently managed with Rapaflo 8 mg daily and finasteride 5 mg daily  BPH WITH LUTS His IPSS score today is 6, which is mild lower urinary tract symptomatology. He is mostly satisfied with his quality life due to his urinary symptoms.  His PVR is 148 mL.   His previous I PSS score was 3/2.  His previous PVR was 90 mL.  He denies any dysuria, hematuria or suprapubic pain.   He currently taking tamsulosin 0.4 mg daily and finasteride 5 mg daily.  He also denies any recent fevers, chills, nausea or vomiting.  He does not have a family history of PCa.     IPSS    Row Name 04/23/16 0900         International Prostate Symptom Score   How often have you had the sensation of not emptying your bladder? Not at All     How often have you had to urinate less than every two hours? Less than 1 in 5 times     How often have you found you stopped and started again several times when you urinated? Less than 1 in 5 times     How often have you found it difficult to postpone urination? Not at All     How often have you had a weak urinary stream? Not at All     How often have you had to strain to start urination? Not at All     How many times did you typically get up at night to urinate? 4 Times     Total IPSS Score 6       Quality of Life due to urinary symptoms   If you were to spend the rest of your life with your urinary condition just the way it is now how would you feel about that? Mostly Satisfied        Score:  1-7 Mild 8-19 Moderate 20-35 Severe  History of urinary retention Patient was seen  at [facility] emergency room  with an episode of acute urinary retention in September 2016.  A Foley catheter was placed with 500 mL residual.  He has since had a voiding trial and has been voiding well.  His PVR today is 148 mL.   PMH: Past Medical History:  Diagnosis Date  . Urinary retention     Surgical History: Past Surgical History:  Procedure Laterality Date  . COLONOSCOPY  2004    Home Medications:  Allergies as of 04/23/2016   No Known Allergies     Medication List       Accurate as of 04/23/16  9:37 AM. Always use your most recent med list.          azithromycin 250 MG tablet Commonly known as:  ZITHROMAX Z-PAK 2 tabs on day #1 then 1 tab for 4 more days   finasteride 5 MG tablet Commonly known as:  PROSCAR Take 1 tablet (5 mg total) by mouth daily.   tamsulosin 0.4 MG Caps capsule Commonly known as:  FLOMAX Take one  capsule in the morning and one in the evening   tiotropium 18 MCG inhalation capsule Commonly known as:  SPIRIVA Place 1 capsule (18 mcg total) into inhaler and inhale daily.   umeclidinium-vilanterol 62.5-25 MCG/INH Aepb Commonly known as:  ANORO ELLIPTA Inhale 1 puff into the lungs daily.       Allergies: No Known Allergies  Family History: Family History  Problem Relation Age of Onset  . Prostate cancer Neg Hx   . Bladder Cancer Neg Hx   . Kidney cancer Neg Hx     Social History:  reports that he has been smoking Cigarettes.  He has been smoking about 0.25 packs per day. He has never used smokeless tobacco. He reports that he drinks alcohol. He reports that he does not use drugs.  ROS: UROLOGY Frequent Urination?: No Hard to postpone urination?: No Burning/pain with urination?: No Get up at night to urinate?: No Leakage of urine?: No Urine stream starts and stops?: No Trouble starting stream?: No Do you have to strain to urinate?: No Blood in urine?: No Urinary tract infection?: No Sexually  transmitted disease?: No Injury to kidneys or bladder?: No Painful intercourse?: No Weak stream?: No Erection problems?: No Penile pain?: No  Gastrointestinal Nausea?: No Vomiting?: No Indigestion/heartburn?: No Diarrhea?: No Constipation?: No  Constitutional Fever: No Night sweats?: No Weight loss?: No Fatigue?: No  Skin Skin rash/lesions?: No Itching?: No  Eyes Blurred vision?: No Double vision?: No  Ears/Nose/Throat Sore throat?: No Sinus problems?: No  Hematologic/Lymphatic Swollen glands?: No Easy bruising?: No  Cardiovascular Leg swelling?: No Chest pain?: No  Respiratory Cough?: No Shortness of breath?: No  Endocrine Excessive thirst?: No  Musculoskeletal Back pain?: No Joint pain?: No  Neurological Headaches?: No Dizziness?: No  Psychologic Depression?: No Anxiety?: No  Physical Exam: BP (!) 104/56   Pulse 62   Ht 5\' 6"  (1.676 m)   Wt 162 lb 9.6 oz (73.8 kg)   BMI 26.24 kg/m   Constitutional: Well nourished. Alert and oriented, No acute distress. HEENT: [place] AT, moist mucus membranes. Trachea midline, no masses. Cardiovascular: No clubbing, cyanosis, or edema. Respiratory: Normal respiratory effort, no increased work of breathing. GI: Abdomen is soft, non tender, non distended, no abdominal masses. Liver and spleen not palpable.  No hernias appreciated.  Stool sample for occult testing is not indicated.   GU: No CVA tenderness.  No bladder fullness or masses.  Patient with circumcised phallus.  Urethral meatus is patent.  No penile discharge. No penile lesions or rashes. Scrotum without lesions, cysts, rashes and/or edema.  Testicles are located scrotally bilaterally. No masses are appreciated in the testicles. Left and right epididymis are normal. Rectal: Patient with  normal sphincter tone. Anus and perineum without scarring or rashes. No rectal masses are appreciated. Prostate is approximately 80 grams, no nodules are appreciated.  Seminal vesicles are normal. Skin: No rashes, bruises or suspicious lesions. Lymph: No cervical or inguinal adenopathy. Neurologic: Grossly intact, no focal deficits, moving all 4 extremities. Psychiatric: Normal mood and affect.  Pertinent Imaging: Results for Norman Waters, Norman Waters (MRN [id number]) as of 04/23/2016 09:34  Ref. Range 04/23/2016 09:27  Scan Result Unknown 148    Assessment & Plan:    1. BPH with LUTS  - IPSS score is 6/2, it is worsening  - Continue conservative management, avoiding bladder irritants and timed voiding's  - most bothersome symptoms is/are nocturia  - Continue tamsulosin 0.4 mg daily and finasteride 5 mg daily: refills given  -  RTC in 12 months for IPSS, PVR and exam   2. HIstory of urinary retention:   Patient is voiding well.  His PVR was .  We will continue to monitor.  He will RTC in one year for a PVR.  He will contact the office if he experiences difficulty urinating.    Return in about 1 year (around 04/23/2017) for IPSS and PVR.  These notes generated with voice recognition software. I apologize for typographical errors.  Michiel Cowboy, PA-C  [facility] [address], [address] [place], [place] [address] [phone]

## 2016-04-23 ENCOUNTER — Ambulatory Visit (INDEPENDENT_AMBULATORY_CARE_PROVIDER_SITE_OTHER): Payer: Medicare Other | Admitting: Urology

## 2016-04-23 ENCOUNTER — Encounter: Payer: Self-pay | Admitting: Urology

## 2016-04-23 ENCOUNTER — Other Ambulatory Visit: Payer: Self-pay | Admitting: Urology

## 2016-04-23 VITALS — BP 104/56 | HR 62 | Ht 66.0 in | Wt 162.6 lb

## 2016-04-23 DIAGNOSIS — Z87898 Personal history of other specified conditions: Secondary | ICD-10-CM

## 2016-04-23 DIAGNOSIS — N138 Other obstructive and reflux uropathy: Secondary | ICD-10-CM | POA: Diagnosis not present

## 2016-04-23 DIAGNOSIS — N401 Enlarged prostate with lower urinary tract symptoms: Secondary | ICD-10-CM | POA: Diagnosis not present

## 2016-04-23 LAB — BLADDER SCAN AMB NON-IMAGING: Scan Result: 148

## 2016-04-23 MED ORDER — FINASTERIDE 5 MG PO TABS: 5.0000 mg | ORAL_TABLET | Freq: Every day | ORAL | 3 refills | Status: DC

## 2016-04-23 MED ORDER — TAMSULOSIN HCL 0.4 MG PO CAPS: ORAL_CAPSULE | ORAL | 3 refills | Status: DC

## 2016-07-16 ENCOUNTER — Other Ambulatory Visit: Payer: Self-pay | Admitting: Urology

## 2016-08-13 ENCOUNTER — Other Ambulatory Visit: Payer: Self-pay | Admitting: Internal Medicine

## 2016-10-20 ENCOUNTER — Emergency Department
Admission: EM | Admit: 2016-10-20 | Discharge: 2016-10-20 | Disposition: A | Discharge status: Home / Self Care | Payer: Medicare Other | Attending: Emergency Medicine | Admitting: Emergency Medicine

## 2016-10-20 DIAGNOSIS — F17210 Nicotine dependence, cigarettes, uncomplicated: Secondary | ICD-10-CM | POA: Insufficient documentation

## 2016-10-20 DIAGNOSIS — S61313A Laceration without foreign body of left middle finger with damage to nail, initial encounter: Secondary | ICD-10-CM | POA: Diagnosis not present

## 2016-10-20 DIAGNOSIS — Y93G1 Activity, food preparation and clean up: Secondary | ICD-10-CM | POA: Diagnosis not present

## 2016-10-20 DIAGNOSIS — Y929 Unspecified place or not applicable: Secondary | ICD-10-CM | POA: Insufficient documentation

## 2016-10-20 DIAGNOSIS — J449 Chronic obstructive pulmonary disease, unspecified: Secondary | ICD-10-CM | POA: Insufficient documentation

## 2016-10-20 DIAGNOSIS — W260XXA Contact with knife, initial encounter: Secondary | ICD-10-CM | POA: Diagnosis not present

## 2016-10-20 DIAGNOSIS — Y999 Unspecified external cause status: Secondary | ICD-10-CM | POA: Diagnosis not present

## 2016-10-20 DIAGNOSIS — Z79899 Other long term (current) drug therapy: Secondary | ICD-10-CM | POA: Diagnosis not present

## 2016-10-20 DIAGNOSIS — S61213A Laceration without foreign body of left middle finger without damage to nail, initial encounter: Secondary | ICD-10-CM | POA: Diagnosis not present

## 2016-10-20 MED ORDER — CEPHALEXIN 500 MG PO CAPS
500.0000 mg | ORAL_CAPSULE | Freq: Once | ORAL | Status: AC
  Administered 2016-10-20: 500 mg via ORAL
  Filled 2016-10-20: qty 1

## 2016-10-20 MED ORDER — CEPHALEXIN 500 MG PO CAPS: 500.0000 mg | ORAL_CAPSULE | Freq: Three times a day (TID) | ORAL | 0 refills | Status: AC

## 2016-10-20 MED ORDER — HYDROCODONE-ACETAMINOPHEN 5-325 MG PO TABS
1.0000 | ORAL_TABLET | Freq: Once | ORAL | Status: AC
  Administered 2016-10-20: 1 via ORAL
  Filled 2016-10-20: qty 1

## 2016-10-20 MED ORDER — HYDROCODONE-ACETAMINOPHEN 5-325 MG PO TABS: 1.0000 | ORAL_TABLET | Freq: Four times a day (QID) | ORAL | 0 refills | Status: AC | PRN

## 2016-10-20 NOTE — ED Notes (Signed)
Pt was cutting vegetables and caused laceration to left middle finger - bleeding is controlled at this time

## 2016-10-20 NOTE — ED Provider Notes (Signed)
[facility] Emergency Department Provider Note  ____________________________________________  Time seen: Approximately 8:40 PM  I have reviewed the triage vital signs and the nursing notes.   HISTORY  Chief Complaint Laceration (3rd digit left hand)    HPI Norman Waters is a 83 y.o. male presenting to the emergency department with left middle finger pain after patient sustained an avulsion type laceration while cutting vegetables tonight. Patient immediately applied an herbal "quick clot". He denies radiculopathy or changes in sensation of the left upper extremity. Tetanus status up-to-date.   Past Medical History:  Diagnosis Date  . Urinary retention     Patient Active Problem List   Diagnosis Date Noted  . Urinary retention 04/25/2015  . COPD, mild (HCC) 11/21/2014  . BPH with obstruction/lower urinary tract symptoms 10/25/2014  . Tobacco use disorder 10/25/2014    Past Surgical History:  Procedure Laterality Date  . COLONOSCOPY  2004    Prior to Admission medications   Medication Sig Start Date End Date Taking? Authorizing Provider  ANORO ELLIPTA 62.5-25 MCG/INH AEPB INHALE 1 PUFF INTO THE LUNGS DAILY 08/14/16   Reubin Milan, MD  azithromycin (ZITHROMAX Z-PAK) 250 MG tablet 2 tabs on day #1 then 1 tab for 4 more days Patient not taking: Reported on 04/23/2016 12/05/15   Reubin Milan, MD  cephALEXin (KEFLEX) 500 MG capsule Take 1 capsule (500 mg total) by mouth 3 (three) times daily. 10/20/16 10/30/16  Orvil Feil, PA-C  finasteride (PROSCAR) 5 MG tablet Take 1 tablet (5 mg total) by mouth daily. 04/23/16   Michiel Cowboy A, PA-C  finasteride (PROSCAR) 5 MG tablet TAKE 1 TABLET(5 MG) BY MOUTH DAILY 07/16/16   McGowan, Carollee Herter A, PA-C  HYDROcodone-acetaminophen (NORCO/VICODIN) 5-325 MG tablet Take 1 tablet by mouth every 6 (six) hours as needed for moderate pain. 10/20/16 10/22/16  Orvil Feil, PA-C  tamsulosin (FLOMAX) 0.4 MG CAPS capsule  Take one capsule in the morning and one in the evening 04/23/16   McGowan, Carollee Herter A, PA-C  tiotropium (SPIRIVA) 18 MCG inhalation capsule Place 1 capsule (18 mcg total) into inhaler and inhale daily. 11/21/14   Reubin Milan, MD    Allergies Patient has no known allergies.  Family History  Problem Relation Age of Onset  . Prostate cancer Neg Hx   . Bladder Cancer Neg Hx   . Kidney cancer Neg Hx     Social History Social History  Substance Use Topics  . Smoking status: Current Every Day Smoker    Packs/day: 0.25    Types: Cigarettes  . Smokeless tobacco: Never Used  . Alcohol use 0.0 oz/week     Review of Systems  Constitutional: No fever/chills Eyes: No visual changes. No discharge ENT: No upper respiratory complaints. Cardiovascular: no chest pain. Respiratory: no cough. No SOB. Gastrointestinal: No abdominal pain.  No nausea, no vomiting.  No diarrhea.  No constipation. Musculoskeletal: Negative for musculoskeletal pain. Skin: Patient has laceration.  Neurological: Negative for headaches, focal weakness or numbness.   ____________________________________________   PHYSICAL EXAM:  VITAL SIGNS: ED Triage Vitals  Enc Vitals Group     BP 10/20/16 1924 (!) 142/60     Pulse Rate 10/20/16 1924 61     Resp 10/20/16 1924 17     Temp 10/20/16 1924 97.6 F (36.4 C)     Temp Source 10/20/16 1924 Oral     SpO2 10/20/16 1924 96 %     Weight 10/20/16 1925 165 lb (74.8  kg)     Height --      Head Circumference --      Peak Flow --      Pain Score 10/20/16 1924 7     Pain Loc --      Pain Edu? --      Excl. in GC? --      Constitutional: Alert and oriented. Well appearing and in no acute distress. Eyes: Conjunctivae are normal. PERRL. EOMI. Head: Atraumatic. Cardiovascular: Normal rate, regular rhythm. Normal S1 and S2.  Good peripheral circulation. Respiratory: Normal respiratory effort without tachypnea or retractions. Lungs CTAB. Good air entry to the bases  with no decreased or absent breath sounds. Musculoskeletal: Patient is able to perform resisted flexion and extension at the PIP and DIP joints of the left third digit. Palpable radial pulse, left. Neurologic:  Normal speech and language. No gross focal neurologic deficits are appreciated.  Skin: Patient has avulsion-type laceration affecting the dorsal aspect of the left third digit. Psychiatric: Mood and affect are normal. Speech and behavior are normal. Patient exhibits appropriate insight and judgement.   ____________________________________________   LABS (all labs ordered are listed, but only abnormal results are displayed)  Labs Reviewed - No data to display ____________________________________________  EKG   ____________________________________________  RADIOLOGY   No results found.  ____________________________________________    PROCEDURES  Procedure(s) performed:    Procedures    Medications  cephALEXin (KEFLEX) capsule 500 mg (not administered)  HYDROcodone-acetaminophen (NORCO/VICODIN) 5-325 MG per tablet 1 tablet (not administered)     ____________________________________________   INITIAL IMPRESSION / ASSESSMENT AND PLAN / ED COURSE  Pertinent labs & imaging results that were available during my care of the patient were reviewed by me and considered in my medical decision making (see chart for details).  Review of the [place] CSRS was performed in accordance of the [facility] prior to dispensing any controlled drugs.     Assessment and plan Avulsion type laceration of left third digit Patient presents to the emergency department with an avulsion type laceration of the left third digit. Basic wound care was provided in the emergency department. Patient was given Vicodin and discharged with Keflex and Vicodin. A finger splint was applied and patient was advised to follow up with orthopedics, Dr. Odis Luster. Vital signs are reassuring prior to discharge. All  patients questions were answered.     ____________________________________________  FINAL CLINICAL IMPRESSION(S) / ED DIAGNOSES  Final diagnoses:  Laceration of left middle finger without foreign body with damage to nail, initial encounter      NEW MEDICATIONS STARTED DURING THIS VISIT:  New Prescriptions   CEPHALEXIN (KEFLEX) 500 MG CAPSULE    Take 1 capsule (500 mg total) by mouth 3 (three) times daily.   HYDROCODONE-ACETAMINOPHEN (NORCO/VICODIN) 5-325 MG TABLET    Take 1 tablet by mouth every 6 (six) hours as needed for moderate pain.        This chart was dictated using voice recognition software/Dragon. Despite best efforts to proofread, errors can occur which can change the meaning. Any change was purely unintentional.    Gasper Lloyd 10/20/16 Darlina Sicilian, MD 10/24/16 [phone]

## 2016-10-20 NOTE — ED Triage Notes (Signed)
Patient cut 3rd digit left hand while chopping vegetables. Laceration proximal to nail bed. Patient applied herbal 'quick clot' type treatment to stop bleeding before coming to this ED

## 2016-11-12 DIAGNOSIS — Z23 Encounter for immunization: Secondary | ICD-10-CM | POA: Diagnosis not present

## 2017-04-21 NOTE — Progress Notes (Signed)
10:53 AM   Norman Waters [birth year] [id number]  Referring provider: Reubin Waters, Norman H, MD [address] [place], [place] [id number]  Chief Complaint  Patient presents with  . Benign Prostatic Hypertrophy    HPI: Patient is an 84 year old Asian male who presents today for one year follow up for BPH with L UTS and history of urinary retention which is currently managed with tamsulosin 0.4 mg daily and finasteride 5 mg daily with his son, Norman Waters and interpreter, Norman Waters.    BPH WITH LUTS His IPSS score today is 3, which is mild lower urinary tract symptomatology. He is mostly satisfied with his quality life due to his urinary symptoms.  His PVR is 113 mL.  His previous I PSS score was 6/2.  His previous PVR was 148 mL.  He denies any dysuria, hematuria or suprapubic pain.   He currently taking tamsulosin 0.4 mg daily, but he stopped the finasteride 5 mg daily due to stomach upset.  He also denies any recent fevers, chills, nausea or vomiting.  He does not have a family history of PCa. IPSS    Row Name 04/22/17 1000         International Prostate Symptom Score   How often have you had the sensation of not emptying your bladder?  Not at All     How often have you had to urinate less than every two hours?  Not at All     How often have you found you stopped and started again several times when you urinated?  Not at All     How often have you found it difficult to postpone urination?  Not at All     How often have you had a weak urinary stream?  Not at All     How often have you had to strain to start urination?  Not at All     How many times did you typically get up at night to urinate?  3 Times     Total IPSS Score  3       Quality of Life due to urinary symptoms   If you were to spend the rest of your life with your urinary condition just the way it is now how would you feel about that?  Mostly Satisfied        Score:  1-7 Mild 8-19 Moderate 20-35 Severe  History of  urinary retention Patient was seen at [facility] emergency room  with an episode of acute urinary retention in September 2016.  A Foley catheter was placed with 500 mL residual.  He has since had a voiding trial and has been voiding well.  His PVR today is 113 mL.   PMH: Past Medical History:  Diagnosis Date  . Urinary retention     Surgical History: Past Surgical History:  Procedure Laterality Date  . COLONOSCOPY  2004    Home Medications:  Allergies as of 04/22/2017   No Known Allergies     Medication List        Accurate as of 04/22/17 10:53 AM. Always use your most recent med list.          ANORO ELLIPTA 62.5-25 MCG/INH Aepb Generic drug:  umeclidinium-vilanterol INHALE 1 PUFF INTO THE LUNGS DAILY   finasteride 5 MG tablet Commonly known as:  PROSCAR Take 1 tablet (5 mg total) by mouth daily.   tamsulosin 0.4 MG Caps capsule Commonly known as:  FLOMAX Take one capsule in the  morning and one in the evening       Allergies: No Known Allergies  Family History: Family History  Problem Relation Age of Onset  . Prostate cancer Neg Hx   . Bladder Cancer Neg Hx   . Kidney cancer Neg Hx     Social History:  reports that he has been smoking cigarettes.  He has been smoking about 0.25 packs per day. He has never used smokeless tobacco. He reports that he drinks alcohol. He reports that he does not use drugs.  ROS: UROLOGY Frequent Urination?: No Hard to postpone urination?: No Burning/pain with urination?: No Get up at night to urinate?: Yes Leakage of urine?: No Urine stream starts and stops?: No Trouble starting stream?: No Do you have to strain to urinate?: No Blood in urine?: No Urinary tract infection?: No Sexually transmitted disease?: No Injury to kidneys or bladder?: No Painful intercourse?: No Weak stream?: No Erection problems?: No Penile pain?: No  Gastrointestinal Nausea?: No Vomiting?: No Indigestion/heartburn?:  No Diarrhea?: No Constipation?: No  Constitutional Fever: No Night sweats?: No Weight loss?: No Fatigue?: No  Skin Skin rash/lesions?: No Itching?: Yes  Eyes Blurred vision?: No Double vision?: No  Ears/Nose/Throat Sore throat?: No Sinus problems?: No  Hematologic/Lymphatic Swollen glands?: No Easy bruising?: No  Cardiovascular Leg swelling?: No Chest pain?: No  Respiratory Cough?: No Shortness of breath?: Yes  Endocrine Excessive thirst?: No  Musculoskeletal Back pain?: No Joint pain?: No  Neurological Headaches?: No Dizziness?: No  Psychologic Depression?: No Anxiety?: No  Physical Exam: BP (!) 143/72 (BP Location: Right Arm, Patient Position: Sitting, Cuff Size: Normal)   Pulse 61   Ht 5\' 6"  (1.676 m)   Wt 151 lb 12.8 oz (68.9 kg)   BMI 24.50 kg/m   Constitutional: Well nourished. Alert and oriented, No acute distress. HEENT: [place] AT, moist mucus membranes. Trachea midline, no masses. Cardiovascular: No clubbing, cyanosis, or edema. Respiratory: Normal respiratory effort, no increased work of breathing. GI: Abdomen is soft, non tender, non distended, no abdominal masses. Liver and spleen not palpable.  No hernias appreciated.  Stool sample for occult testing is not indicated.   GU: No CVA tenderness.  No bladder fullness or masses.  Patient with circumcised phallus.  Urethral meatus is patent.  No penile discharge. No penile lesions or rashes. Scrotum without lesions, cysts, rashes and/or edema.  Testicles are located scrotally bilaterally. No masses are appreciated in the testicles. Left and right epididymis are normal. Rectal: Patient with  normal sphincter tone. Anus and perineum without scarring or rashes. No rectal masses are appreciated. Prostate is approximately 80 + grams, no nodules are appreciated. Seminal vesicles are normal. Skin: No rashes, bruises or suspicious lesions. Lymph: No cervical or inguinal adenopathy. Neurologic: Grossly  intact, no focal deficits, moving all 4 extremities. Psychiatric: Normal mood and affect.    Pertinent Imaging: Results for [name], [name] (MRN [id number]) as of 04/22/2017 10:52  Ref. Range 04/22/2017 10:13  Scan Result Unknown 113     Assessment & Plan:    1. BPH with LUTS  - IPSS score is 3/2, it is improving  - Continue conservative management, avoiding bladder irritants and timed voiding's  - most bothersome symptoms is/are nocturia  - Continue tamsulosin 0.4 mg daily; refills given  - RTC in 12 months for IPSS, PVR and exam   2. History of urinary retention:   Patient is voiding well.  His PVR was 113 mL.  We will continue to monitor.  He  will RTC in one year for a PVR.  He will contact the office if he experiences difficulty urinating.    Return in about 1 year (around 04/23/2018) for IPSS, exam and PVR.  These notes generated with voice recognition software. I apologize for typographical errors.  Michiel Cowboy, PA-C  [facility] [address] [place], [place] [address] [phone]

## 2017-04-22 ENCOUNTER — Ambulatory Visit (INDEPENDENT_AMBULATORY_CARE_PROVIDER_SITE_OTHER): Payer: Medicare HMO | Admitting: Urology

## 2017-04-22 ENCOUNTER — Encounter: Payer: Self-pay | Admitting: Urology

## 2017-04-22 VITALS — BP 143/72 | HR 61 | Ht 66.0 in | Wt 151.8 lb

## 2017-04-22 DIAGNOSIS — N401 Enlarged prostate with lower urinary tract symptoms: Secondary | ICD-10-CM

## 2017-04-22 DIAGNOSIS — N138 Other obstructive and reflux uropathy: Secondary | ICD-10-CM

## 2017-04-22 DIAGNOSIS — Z87898 Personal history of other specified conditions: Secondary | ICD-10-CM

## 2017-04-22 LAB — BLADDER SCAN AMB NON-IMAGING: SCAN RESULT: 113

## 2017-04-22 MED ORDER — TAMSULOSIN HCL 0.4 MG PO CAPS
ORAL_CAPSULE | ORAL | 3 refills | Status: DC
Start: 2017-04-22 — End: 2017-10-28

## 2017-06-17 DIAGNOSIS — L820 Inflamed seborrheic keratosis: Secondary | ICD-10-CM | POA: Diagnosis not present

## 2017-06-17 DIAGNOSIS — L853 Xerosis cutis: Secondary | ICD-10-CM | POA: Diagnosis not present

## 2017-06-17 DIAGNOSIS — D692 Other nonthrombocytopenic purpura: Secondary | ICD-10-CM | POA: Diagnosis not present

## 2017-08-23 ENCOUNTER — Other Ambulatory Visit: Payer: Self-pay | Admitting: Internal Medicine

## 2017-09-05 ENCOUNTER — Encounter: Payer: Self-pay | Admitting: Emergency Medicine

## 2017-09-05 ENCOUNTER — Other Ambulatory Visit: Payer: Self-pay

## 2017-09-05 ENCOUNTER — Emergency Department
Admission: EM | Admit: 2017-09-05 | Discharge: 2017-09-05 | Disposition: A | Discharge status: Home / Self Care | Payer: Medicare HMO | Attending: Emergency Medicine | Admitting: Emergency Medicine

## 2017-09-05 ENCOUNTER — Emergency Department: Payer: Medicare HMO

## 2017-09-05 DIAGNOSIS — R1031 Right lower quadrant pain: Secondary | ICD-10-CM | POA: Diagnosis not present

## 2017-09-05 DIAGNOSIS — J449 Chronic obstructive pulmonary disease, unspecified: Secondary | ICD-10-CM | POA: Insufficient documentation

## 2017-09-05 DIAGNOSIS — F17210 Nicotine dependence, cigarettes, uncomplicated: Secondary | ICD-10-CM | POA: Insufficient documentation

## 2017-09-05 DIAGNOSIS — K5900 Constipation, unspecified: Secondary | ICD-10-CM | POA: Diagnosis not present

## 2017-09-05 DIAGNOSIS — R109 Unspecified abdominal pain: Secondary | ICD-10-CM

## 2017-09-05 DIAGNOSIS — R1084 Generalized abdominal pain: Secondary | ICD-10-CM | POA: Insufficient documentation

## 2017-09-05 DIAGNOSIS — R69 Illness, unspecified: Secondary | ICD-10-CM | POA: Diagnosis not present

## 2017-09-05 HISTORY — DX: Disorder of kidney and ureter, unspecified: N28.9

## 2017-09-05 LAB — CBC
HCT: 46.3 % (ref 40.0–52.0)
HEMOGLOBIN: 16.1 g/dL (ref 13.0–18.0)
MCH: 33.8 pg (ref 26.0–34.0)
MCHC: 34.8 g/dL (ref 32.0–36.0)
MCV: 97.3 fL (ref 80.0–100.0)
Platelets: 146 10*3/uL — ABNORMAL LOW (ref 150–440)
RBC: 4.76 MIL/uL (ref 4.40–5.90)
RDW: 13.7 % (ref 11.5–14.5)
WBC: 6.8 10*3/uL (ref 3.8–10.6)

## 2017-09-05 LAB — URINALYSIS, COMPLETE (UACMP) WITH MICROSCOPIC
BACTERIA UA: NONE SEEN
Bilirubin Urine: NEGATIVE
GLUCOSE, UA: NEGATIVE mg/dL
Ketones, ur: NEGATIVE mg/dL
Leukocytes, UA: NEGATIVE
Nitrite: NEGATIVE
Protein, ur: NEGATIVE mg/dL
Specific Gravity, Urine: 1.016 (ref 1.005–1.030)
pH: 5 (ref 5.0–8.0)

## 2017-09-05 LAB — COMPREHENSIVE METABOLIC PANEL
ALBUMIN: 4.7 g/dL (ref 3.5–5.0)
ALK PHOS: 82 U/L (ref 38–126)
ALT: 14 U/L (ref 0–44)
ANION GAP: 6 (ref 5–15)
AST: 20 U/L (ref 15–41)
BUN: 18 mg/dL (ref 8–23)
CALCIUM: 9.1 mg/dL (ref 8.9–10.3)
CHLORIDE: 107 mmol/L (ref 98–111)
CO2: 27 mmol/L (ref 22–32)
Creatinine, Ser: 0.96 mg/dL (ref 0.61–1.24)
GFR calc Af Amer: >60 mL/min (ref >60)
GFR calc non Af Amer: >60 mL/min (ref >60)
GLUCOSE: 104 mg/dL — AB (ref 70–99)
Potassium: 4.1 mmol/L (ref 3.5–5.1)
SODIUM: 140 mmol/L (ref 135–145)
Total Bilirubin: 1.2 mg/dL (ref 0.3–1.2)
Total Protein: 7.3 g/dL (ref 6.5–8.1)

## 2017-09-05 LAB — LIPASE, BLOOD: LIPASE: 38 U/L (ref 11–51)

## 2017-09-05 MED ORDER — POLYETHYLENE GLYCOL 3350 17 G PO PACK: 17.0000 g | PACK | Freq: Every day | ORAL | 0 refills | Status: AC

## 2017-09-05 MED ORDER — CYCLOBENZAPRINE HCL 10 MG PO TABS: 10.0000 mg | ORAL_TABLET | Freq: Three times a day (TID) | ORAL | 0 refills | Status: AC | PRN

## 2017-09-05 MED ORDER — ACETAMINOPHEN 500 MG PO TABS
1000.0000 mg | ORAL_TABLET | Freq: Once | ORAL | Status: AC
  Administered 2017-09-05: 1000 mg via ORAL
  Filled 2017-09-05: qty 2

## 2017-09-05 MED ORDER — DIAZEPAM 5 MG PO TABS
5.0000 mg | ORAL_TABLET | Freq: Once | ORAL | Status: AC
  Administered 2017-09-05: 5 mg via ORAL
  Filled 2017-09-05: qty 1

## 2017-09-05 MED ORDER — IBUPROFEN 600 MG PO TABS: 600.0000 mg | ORAL_TABLET | Freq: Two times a day (BID) | ORAL | 0 refills | Status: AC | PRN

## 2017-09-05 NOTE — ED Provider Notes (Signed)
[facility] Emergency Department Provider Note       Time seen: ----------------------------------------- 8:32 PM on 09/05/2017 -----------------------------------------   I have reviewed the triage vital signs and the nursing notes.  HISTORY   Chief Complaint Constipation and Flank Pain    HPI Norman Waters is a 84 y.o. male with a history of kidney stones and urinary retention who presents to the ED for abdominal pain and constipation.  His last bowel movement was Monday.  He also complains of right lower back and flank pain.  Patient states symptoms started after mowing his lawn.  He has not been taking any over-the-counter medications for his symptoms.  He denies any other complaints.  Past Medical History:  Diagnosis Date  . Renal disorder    kidney stones  . Urinary retention     Patient Active Problem List   Diagnosis Date Noted  . Urinary retention 04/25/2015  . COPD, mild (HCC) 11/21/2014  . BPH with obstruction/lower urinary tract symptoms 10/25/2014  . Tobacco use disorder 10/25/2014    Past Surgical History:  Procedure Laterality Date  . COLONOSCOPY  2004    Allergies Patient has no known allergies.  Social History Social History   Tobacco Use  . Smoking status: Current Every Day Smoker    Packs/day: 0.25    Types: Cigarettes  . Smokeless tobacco: Never Used  Substance Use Topics  . Alcohol use: Yes    Alcohol/week: 0.0 standard drinks  . Drug use: No   Review of Systems Constitutional: Negative for fever. Cardiovascular: Negative for chest pain. Respiratory: Negative for shortness of breath. Gastrointestinal: Positive for flank pain, constipation Musculoskeletal: Positive for back pain Skin: Negative for rash. Neurological: Negative for headaches, focal weakness or numbness.  All systems negative/normal/unremarkable except as stated in the HPI  ____________________________________________   PHYSICAL EXAM:  VITAL  SIGNS: ED Triage Vitals  Enc Vitals Group     BP 09/05/17 1820 (!) 134/48     Pulse Rate 09/05/17 1820 (!) 59     Resp 09/05/17 1820 (!) 98     Temp 09/05/17 1820 (!) 97.5 F (36.4 C)     Temp Source 09/05/17 1820 Oral     SpO2 09/05/17 1820 98 %     Weight 09/05/17 1818 151 lb (68.5 kg)     Height 09/05/17 1818 5\' 9"  (1.753 m)     Head Circumference --      Peak Flow --      Pain Score 09/05/17 1817 7     Pain Loc --      Pain Edu? --      Excl. in GC? --    Constitutional: Alert and oriented. Well appearing and in no distress. Eyes: Conjunctivae are normal. Normal extraocular movements. ENT   Head: Normocephalic and atraumatic.   Nose: No congestion/rhinnorhea.   Mouth/Throat: Mucous membranes are moist.   Neck: No stridor. Cardiovascular: Normal rate, regular rhythm. No murmurs, rubs, or gallops. Respiratory: Normal respiratory effort without tachypnea nor retractions. Breath sounds are clear and equal bilaterally. No wheezes/rales/rhonchi. Gastrointestinal: Soft and nontender. Normal bowel sounds Musculoskeletal: Nontender with normal range of motion in extremities.  Tenderness near the right SI joint as well as the right gluteus muscles and lumbosacral region Neurologic:  Normal speech and language. No gross focal neurologic deficits are appreciated.  Skin:  Skin is warm, dry and intact. No rash noted. Psychiatric: Mood and affect are normal. Speech and behavior are normal.  ____________________________________________  ED  COURSE:  As part of my medical decision making, I reviewed the following data within the electronic MEDICAL RECORD NUMBER History obtained from family if available, nursing notes, old chart and ekg, as well as notes from prior ED visits. Patient presented for flank pain, we will assess with labs and imaging as indicated at this time.   Procedures ____________________________________________   LABS (pertinent positives/negatives)  Labs  Reviewed  COMPREHENSIVE METABOLIC PANEL - Abnormal; Notable for the following components:      Result Value   Glucose, Bld 104 (*)    All other components within normal limits  CBC - Abnormal; Notable for the following components:   Platelets 146 (*)    All other components within normal limits  URINALYSIS, COMPLETE (UACMP) WITH MICROSCOPIC - Abnormal; Notable for the following components:   Color, Urine YELLOW (*)    APPearance CLEAR (*)    Hgb urine dipstick SMALL (*)    All other components within normal limits  LIPASE, BLOOD    RADIOLOGY Images were viewed by me  CT renal protocol IMPRESSION: 1. No acute process identified. No findings of hydronephrosis, urinary stone disease, or constipation. 2. Cholelithiasis. 3. Severe prostate enlargement, 130 cc. 4. 25 mm infrarenal abdominal aortic ectasia. Ectatic abdominal aorta at risk for aneurysm development. Recommend followup by ultrasound in 5 years. This recommendation follows ACR consensus guidelines: White Paper of the ACR Incidental Findings Committee II on Vascular Findings. J Am Coll Radiol 2013; 10:789-794. 5. 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 ____________________________________________  DIFFERENTIAL DIAGNOSIS   Renal colic, UTI, constipation, gas pain, muscle strain  FINAL ASSESSMENT AND PLAN  Flank pain   Plan: The patient had presented for abdominal pain as well as constipation and flank pain. Patient's labs did not reveal any acute process. Patient's imaging was also reassuring.  This is likely musculoskeletal in origin and we will prescribe anti-inflammatory muscle relaxants for him.   Ulice Dash, MD   Note: This note was generated in part or whole with voice recognition software. Voice recognition is usually quite accurate but there are transcription errors that  can and very often do occur. I apologize for any typographical errors that were not detected and corrected.     Emily Filbert, MD 09/05/17 [phone]

## 2017-09-05 NOTE — ED Triage Notes (Signed)
Arrives from [facility] for ED evaluation of abdominal pain / constipation. Last BM Monday.  Patient c/o right lower back / flank pain

## 2017-09-09 ENCOUNTER — Encounter: Payer: Self-pay | Admitting: Family Medicine

## 2017-09-09 ENCOUNTER — Ambulatory Visit (INDEPENDENT_AMBULATORY_CARE_PROVIDER_SITE_OTHER): Payer: Medicare HMO | Admitting: Family Medicine

## 2017-09-09 VITALS — BP 162/70 | HR 57 | Ht 69.0 in | Wt 150.9 lb

## 2017-09-09 DIAGNOSIS — R339 Retention of urine, unspecified: Secondary | ICD-10-CM | POA: Diagnosis not present

## 2017-09-09 NOTE — Progress Notes (Signed)
Cath Change/ Replacement  Patient is present today for a catheter change due to urinary retention.   A 16 Coude foley cath was replaced into the bladder no complications were noted Urine return was noted 800ml and urine was yellow in color. The balloon was filled with 10ml of sterile water. A leg bag was attached for drainage.  A night bag was also given to the patient and patient was given instruction on how to change from one bag to another. Patient was given proper instruction on catheter care.    Preformed by: Teressa Lowerarrie Shaelin Lalley, CMA  Follow up: 1 week TOV

## 2017-09-18 ENCOUNTER — Ambulatory Visit
Admission: RE | Admit: 2017-09-18 | Discharge: 2017-09-18 | Disposition: A | Discharge status: Home / Self Care | Payer: Medicare HMO | Source: Ambulatory Visit | Attending: Urology | Admitting: Urology

## 2017-09-18 ENCOUNTER — Telehealth: Payer: Self-pay

## 2017-09-18 ENCOUNTER — Ambulatory Visit: Payer: Medicare HMO

## 2017-09-18 VITALS — BP 140/70 | HR 60 | Ht 66.0 in | Wt 151.0 lb

## 2017-09-18 DIAGNOSIS — R109 Unspecified abdominal pain: Secondary | ICD-10-CM

## 2017-09-18 NOTE — Telephone Encounter (Signed)
-----   Message from Harle BattiestShannon A McGowan, PA-C sent at 09/18/2017 11:44 AM EDT ----- Please let Mr. Juel BurrowLin know that his x-ray was negative for stone.  I would suggest if the pain persists to make a return appointment

## 2017-09-18 NOTE — Progress Notes (Signed)
Catheter Removal  Patient is present today for a catheter removal.  10ml of water was drained from the balloon. A 16FR foley cath was removed from the bladder no complications were noted . Patient tolerated well.  Preformed by: Nydia Boutonhristopher Thompson, CMA  Blood pressure 140/70, pulse 60, height 5\' 6"  (1.676 m), weight 151 lb (68.5 kg).  Follow up/ Additional notes: Return by 1400 if unable to urinate.  Pt complaining of back and flank pain, KUB ordered,

## 2017-09-18 NOTE — Telephone Encounter (Signed)
Interpreter [phone]  Spoke with patient's son he states he is not sure if there is still pain or where it is . He will call back to make an apt if so. Patient is urinating since cath removal this morning/

## 2017-09-23 ENCOUNTER — Other Ambulatory Visit: Payer: Self-pay | Admitting: Urology

## 2017-10-22 ENCOUNTER — Other Ambulatory Visit: Payer: Self-pay | Admitting: Urology

## 2017-10-23 ENCOUNTER — Telehealth: Payer: Self-pay | Admitting: Urology

## 2017-10-23 NOTE — Telephone Encounter (Signed)
Mr. Gahm was seen in the ED for retention in 08/2017 and had a TOV in our office.  We need to see him for an I PSS and PVR.

## 2017-10-23 NOTE — Telephone Encounter (Signed)
Patient notified and scheduled appointment.  

## 2017-10-28 ENCOUNTER — Ambulatory Visit (INDEPENDENT_AMBULATORY_CARE_PROVIDER_SITE_OTHER): Payer: Medicare HMO | Admitting: Urology

## 2017-10-28 ENCOUNTER — Encounter: Payer: Self-pay | Admitting: Urology

## 2017-10-28 VITALS — BP 122/62 | HR 66 | Ht 64.0 in | Wt 149.9 lb

## 2017-10-28 DIAGNOSIS — N138 Other obstructive and reflux uropathy: Secondary | ICD-10-CM

## 2017-10-28 DIAGNOSIS — N401 Enlarged prostate with lower urinary tract symptoms: Secondary | ICD-10-CM | POA: Diagnosis not present

## 2017-10-28 DIAGNOSIS — Z87898 Personal history of other specified conditions: Secondary | ICD-10-CM

## 2017-10-28 LAB — BLADDER SCAN AMB NON-IMAGING: Scan Result: 121

## 2017-10-28 MED ORDER — TAMSULOSIN HCL 0.4 MG PO CAPS: ORAL_CAPSULE | ORAL | 3 refills | Status: DC

## 2017-10-28 MED ORDER — FINASTERIDE 5 MG PO TABS: 5.0000 mg | ORAL_TABLET | Freq: Every day | ORAL | 3 refills | Status: DC

## 2017-10-28 NOTE — Progress Notes (Signed)
9:35 AM   Norman Waters Sep 12, 1931 [id number]  Referring provider: Reubin Milan, MD [address] [place], [place] [address]  Chief Complaint  Patient presents with  . Follow-up    HPI: Patient is an 84 year old Asian male with BPH with urinary retention with his son, Ching-Behl and interpreter, Sihg [phone].    History of urinary retention Patient was seen at [facility] emergency room  with an episode of acute urinary retention in September 2016.  A Foley catheter was placed with 500 mL residual.  He had another episode of retention on 09/09/2017 with 800 mL.  Patient had a successful TOV one week later.  His PVR today is 121 mL.   BPH WITH LUTS His IPSS score today is 11, which is moderate lower urinary tract symptomatology. He is mostly satisfied with his quality life due to his urinary symptoms.  His PVR is 121 mL.  His previous I PSS score was 3/2.  His previous PVR was 113 mL.  He denies any dysuria, hematuria or suprapubic pain.   He currently taking tamsulosin 0.4 mg daily and the finasteride 5 mg daily.  He also denies any recent fevers, chills, nausea or vomiting.  He does not have a family history of PCa. IPSS    Row Name 10/28/17 0900         International Prostate Symptom Score   How often have you had the sensation of not emptying your bladder?  About half the time     How often have you had to urinate less than every two hours?  Less than 1 in 5 times     How often have you found you stopped and started again several times when you urinated?  Less than 1 in 5 times     How often have you found it difficult to postpone urination?  Less than 1 in 5 times     How often have you had a weak urinary stream?  Less than 1 in 5 times     How often have you had to strain to start urination?  Less than 1 in 5 times     How many times did you typically get up at night to urinate?  3 Times     Total IPSS Score  11       Quality of Life due to  urinary symptoms   If you were to spend the rest of your life with your urinary condition just the way it is now how would you feel about that?  Mostly Satisfied        Score:  1-7 Mild 8-19 Moderate 20-35 Severe    PMH: Past Medical History:  Diagnosis Date  . Renal disorder    kidney stones  . Urinary retention     Surgical History: Past Surgical History:  Procedure Laterality Date  . COLONOSCOPY  2004    Home Medications:  Allergies as of 10/28/2017   No Known Allergies     Medication List        Accurate as of 10/28/17  9:35 AM. Always use your most recent med list.          ANORO ELLIPTA 62.5-25 MCG/INH Aepb Generic drug:  umeclidinium-vilanterol INHALE 1 PUFF INTO THE LUNGS DAILY   cyclobenzaprine 10 MG tablet Commonly known as:  FLEXERIL Take 1 tablet (10 mg total) by mouth 3 (three) times daily as needed for muscle spasms.   finasteride 5 MG tablet Commonly  known as:  PROSCAR Take 1 tablet (5 mg total) by mouth daily.   ibuprofen 600 MG tablet Commonly known as:  ADVIL,MOTRIN Take 1 tablet (600 mg total) by mouth 2 (two) times daily as needed.   polyethylene glycol packet Commonly known as:  MIRALAX / GLYCOLAX Take 17 g by mouth daily.   tamsulosin 0.4 MG Caps capsule Commonly known as:  FLOMAX Take one capsule in the morning and one in the evening       Allergies: No Known Allergies  Family History: Family History  Problem Relation Age of Onset  . Prostate cancer Neg Hx   . Bladder Cancer Neg Hx   . Kidney cancer Neg Hx     Social History:  reports that he has been smoking cigarettes. He has been smoking about 0.25 packs per day. He has never used smokeless tobacco. He reports that he drinks alcohol. He reports that he does not use drugs.  ROS: UROLOGY Frequent Urination?: No Hard to postpone urination?: No Burning/pain with urination?: No Get up at night to urinate?: Yes Leakage of urine?: No Urine stream starts and stops?:  No Trouble starting stream?: No Do you have to strain to urinate?: No Blood in urine?: No Urinary tract infection?: No Sexually transmitted disease?: No Injury to kidneys or bladder?: No Painful intercourse?: No Weak stream?: No Erection problems?: No Penile pain?: No  Gastrointestinal Nausea?: No Vomiting?: No Indigestion/heartburn?: No Diarrhea?: No Constipation?: No  Constitutional Fever: No Night sweats?: No Weight loss?: No Fatigue?: No  Skin Skin rash/lesions?: No Itching?: No  Eyes Blurred vision?: No Double vision?: No  Ears/Nose/Throat Sore throat?: No Sinus problems?: No  Hematologic/Lymphatic Swollen glands?: No Easy bruising?: No  Cardiovascular Leg swelling?: No Chest pain?: No  Respiratory Cough?: No Shortness of breath?: No  Endocrine Excessive thirst?: No  Musculoskeletal Back pain?: No Joint pain?: No  Neurological Headaches?: No Dizziness?: No  Psychologic Depression?: No Anxiety?: No  Physical Exam: BP 122/62 (BP Location: Left Arm, Patient Position: Sitting, Cuff Size: Normal)   Pulse 66   Ht 5\' 4"  (1.626 m)   Wt 149 lb 14.4 oz (68 kg)   BMI 25.73 kg/m   Constitutional: Well nourished. Alert and oriented, No acute distress. HEENT: [place] AT, moist mucus membranes. Trachea midline, no masses. Cardiovascular: No clubbing, cyanosis, or edema. Respiratory: Normal respiratory effort, no increased work of breathing. GI: Abdomen is soft, non tender, non distended, no abdominal masses. Liver and spleen not palpable.  No hernias appreciated.  Stool sample for occult testing is not indicated.   GU: No CVA tenderness.  No bladder fullness or masses.  Patient with circumcised phallus.    Urethral meatus is patent.  No penile discharge. No penile lesions or rashes. Scrotum without lesions, cysts, rashes and/or edema.  Testicles are located scrotally bilaterally. No masses are appreciated in the testicles. Left and right epididymis are  normal. Rectal: Patient with  normal sphincter tone. Anus and perineum without scarring or rashes. No rectal masses are appreciated. Prostate is massive, could only palpate apex.   Skin: No rashes, bruises or suspicious lesions. Lymph: No cervical or inguinal adenopathy. Neurologic: Grossly intact, no focal deficits, moving all 4 extremities. Psychiatric: Normal mood and affect.   Pertinent Imaging: Results for ARSHAWN, VALDEZ (MRN [id number]) as of 10/28/2017 09:33  Ref. Range 10/28/2017 09:24  Scan Result Unknown 121    Assessment & Plan:    1. BPH with LUTS IPSS score is 11/2, it is worsening Continue  conservative management, avoiding bladder irritants and timed voiding's Continue tamsulosin 0.4 mg daily and finasteride 5 mg daily;refills given RTC in 12 months for IPSS, PVR and exam   2. History of urinary retention:   Patient is voiding well.  His PVR was 121 mL.  We discussed undergoing a cystoscopy to evaluate whether he is a candidate for a bladder outlet procedure.  He is not interested in surgery at this time.  We will continue to monitor.  He will RTC in one year for a I PSS and PVR.  He will contact the office if he experiences difficulty urinating.    Return in about 1 year (around 10/29/2018) for IPSS, PVR and exam.  These notes generated with voice recognition software. I apologize for typographical errors.  Michiel Cowboy, PA-C  [facility] [address] [place], [place] [address] [phone]

## 2017-10-28 NOTE — Progress Notes (Signed)
Interpreter [id number]

## 2017-11-06 ENCOUNTER — Other Ambulatory Visit: Payer: Self-pay | Admitting: Internal Medicine

## 2017-11-12 ENCOUNTER — Ambulatory Visit (INDEPENDENT_AMBULATORY_CARE_PROVIDER_SITE_OTHER): Payer: Medicare HMO

## 2017-11-12 DIAGNOSIS — Z23 Encounter for immunization: Secondary | ICD-10-CM

## 2017-11-14 ENCOUNTER — Other Ambulatory Visit: Payer: Self-pay | Admitting: Urology

## 2018-04-07 DIAGNOSIS — L309 Dermatitis, unspecified: Secondary | ICD-10-CM | POA: Diagnosis not present

## 2018-04-07 DIAGNOSIS — L853 Xerosis cutis: Secondary | ICD-10-CM | POA: Diagnosis not present

## 2018-04-21 ENCOUNTER — Ambulatory Visit: Payer: Medicare HMO | Admitting: Urology

## 2018-04-28 ENCOUNTER — Other Ambulatory Visit: Payer: Self-pay | Admitting: Internal Medicine

## 2018-07-02 ENCOUNTER — Other Ambulatory Visit: Payer: Self-pay | Admitting: Urology

## 2018-10-05 ENCOUNTER — Other Ambulatory Visit: Payer: Self-pay | Admitting: Internal Medicine

## 2018-10-26 NOTE — Progress Notes (Deleted)
9:30 AM   Norman Waters 05/11/1931 [id number]  Referring provider: Reubin Milan, MD [address] [place],  [place] [address]  No chief complaint on file.   HPI: Patient is an 85 year old male with BPH with urinary retention with his son, *** and interpreter, ***  History of urinary retention Patient was seen at [facility] emergency room  with an episode of acute urinary retention in September 2016.  A Foley catheter was placed with 500 mL residual.  He had another episode of retention on 09/09/2017 with 800 mL.  Patient had a successful TOV one week later.  His PVR today is *** mL.   BPH WITH LUTS His IPSS score today is ***, which is *** lower urinary tract symptomatology. He is *** with his quality life due to his urinary symptoms.  His PVR is *** mL.  His previous I PSS score was 11/2.  His previous PVR was 121 mL.  He denies any dysuria, hematuria or suprapubic pain.   He currently taking tamsulosin 0.4 mg daily and the finasteride 5 mg daily.  He also denies any recent fevers, chills, nausea or vomiting.  He does not have a family history of PCa.   Score:  1-7 Mild 8-19 Moderate 20-35 Severe    PMH: Past Medical History:  Diagnosis Date  . Renal disorder    kidney stones  . Urinary retention     Surgical History: Past Surgical History:  Procedure Laterality Date  . COLONOSCOPY  2004    Home Medications:  Allergies as of 10/27/2018   No Known Allergies     Medication List       Accurate as of October 26, 2018  9:30 AM. If you have any questions, ask your nurse or doctor.        Anoro Ellipta 62.5-25 MCG/INH Aepb Generic drug: umeclidinium-vilanterol INHALE 1 PUFF INTO THE LUNGS DAILY   cyclobenzaprine 10 MG tablet Commonly known as: FLEXERIL Take 1 tablet (10 mg total) by mouth 3 (three) times daily as needed for muscle spasms.   finasteride 5 MG tablet Commonly known as: PROSCAR Take 1 tablet (5 mg total) by mouth  daily.   ibuprofen 600 MG tablet Commonly known as: ADVIL Take 1 tablet (600 mg total) by mouth 2 (two) times daily as needed.   polyethylene glycol 17 g packet Commonly known as: MIRALAX / GLYCOLAX Take 17 g by mouth daily.   tamsulosin 0.4 MG Caps capsule Commonly known as: FLOMAX TAKE ONE CAPSULE BY MOUTH IN THE MORNING AND 1 CAPSULE IN THE EVENING.       Allergies: No Known Allergies  Family History: Family History  Problem Relation Age of Onset  . Prostate cancer Neg Hx   . Bladder Cancer Neg Hx   . Kidney cancer Neg Hx     Social History:  reports that he has been smoking cigarettes. He has been smoking about 0.25 packs per day. He has never used smokeless tobacco. He reports current alcohol use. He reports that he does not use drugs.  ROS:                                        Physical Exam: There were no vitals taken for this visit.  Constitutional:  Well nourished. Alert and oriented, No acute distress. HEENT: [place] AT, moist mucus membranes.  Trachea midline, no masses.  Cardiovascular: No clubbing, cyanosis, or edema. Respiratory: Normal respiratory effort, no increased work of breathing. GI: Abdomen is soft, non tender, non distended, no abdominal masses. Liver and spleen not palpable.  No hernias appreciated.  Stool sample for occult testing is not indicated.   GU: No CVA tenderness.  No bladder fullness or masses.  Patient with circumcised/uncircumcised phallus. ***Foreskin easily retracted***  Urethral meatus is patent.  No penile discharge. No penile lesions or rashes. Scrotum without lesions, cysts, rashes and/or edema.  Testicles are located scrotally bilaterally. No masses are appreciated in the testicles. Left and right epididymis are normal. Rectal: Patient with  normal sphincter tone. Anus and perineum without scarring or rashes. No rectal masses are appreciated. Prostate is approximately *** grams, *** nodules are appreciated.  Seminal vesicles are normal. Skin: No rashes, bruises or suspicious lesions. Lymph: No cervical or inguinal adenopathy. Neurologic: Grossly intact, no focal deficits, moving all 4 extremities. Psychiatric: Normal mood and affect.   Pertinent Imaging: ***   Assessment & Plan:    1. BPH with LUTS IPSS score is 11/2, it is worsening Continue conservative management, avoiding bladder irritants and timed voiding's Continue tamsulosin 0.4 mg daily and finasteride 5 mg daily;refills given RTC in 12 months for IPSS, PVR and exam   2. History of urinary retention:   Patient is voiding well.  His PVR was 121 mL.  We discussed undergoing a cystoscopy to evaluate whether he is a candidate for a bladder outlet procedure.  He is not interested in surgery at this time.  We will continue to monitor.  He will RTC in one year for a I PSS and PVR.  He will contact the office if he experiences difficulty urinating.    No follow-ups on file.  These notes generated with voice recognition software. I apologize for typographical errors.  Zara Council, PA-C  [facility] [address] [place], [place] [address] [phone]

## 2018-10-27 ENCOUNTER — Encounter: Payer: Self-pay | Admitting: Urology

## 2018-10-27 ENCOUNTER — Ambulatory Visit: Payer: Medicare HMO | Admitting: Urology

## 2018-12-07 ENCOUNTER — Other Ambulatory Visit: Payer: Self-pay

## 2018-12-07 MED ORDER — FINASTERIDE 5 MG PO TABS: 5.0000 mg | ORAL_TABLET | Freq: Every day | ORAL | 0 refills | Status: DC

## 2019-01-05 ENCOUNTER — Other Ambulatory Visit: Payer: Self-pay | Admitting: Family Medicine

## 2019-01-05 MED ORDER — FINASTERIDE 5 MG PO TABS: 5.0000 mg | ORAL_TABLET | Freq: Every day | ORAL | 11 refills | Status: AC

## 2019-09-08 IMAGING — CT CT RENAL STONE PROTOCOL
2 of 4 series · 16 of 46 positions shown, 18 images · non-contrast
Comparison: None.

CLINICAL DATA: 86 y/o M; abdominal pain and constipation. Right
lower back and right flank pain.

EXAM:
CT ABDOMEN AND PELVIS WITHOUT CONTRAST
TECHNIQUE: Multidetector CT imaging of the abdomen and pelvis was performed
following the standard protocol without IV contrast.

[Series 2: stone full standard · axial · 0.65mm/px · z∈[-1252,-836]mm · 13 of 91 slices shown, 15 images]
[im 4/91  soft-tissue]
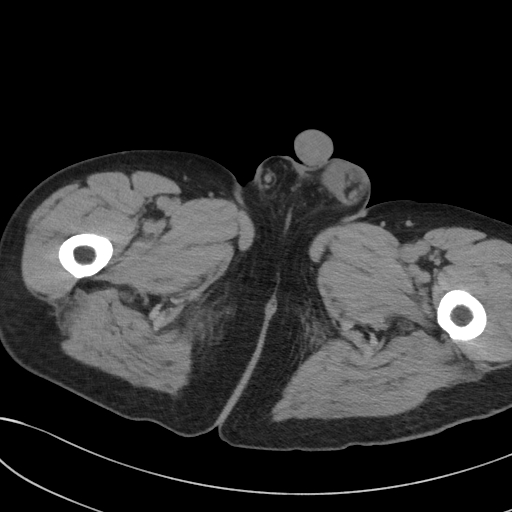
[im 4/91  bone]
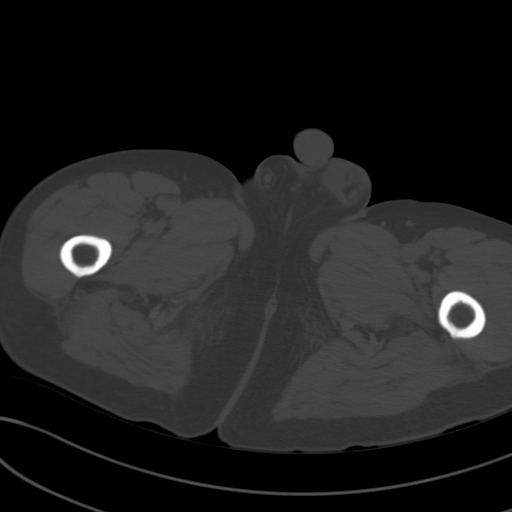
[im 12/91  soft-tissue]
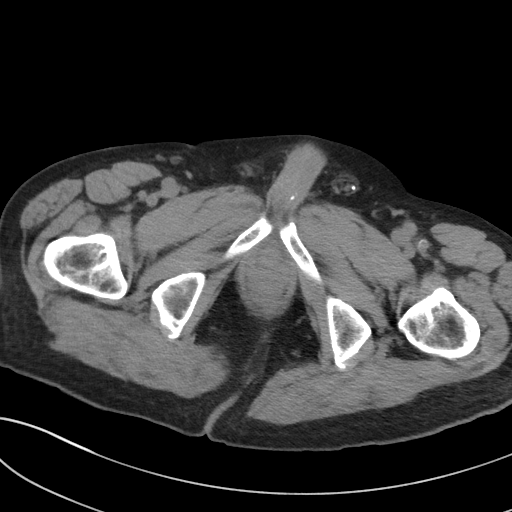
[im 19/91  soft-tissue]
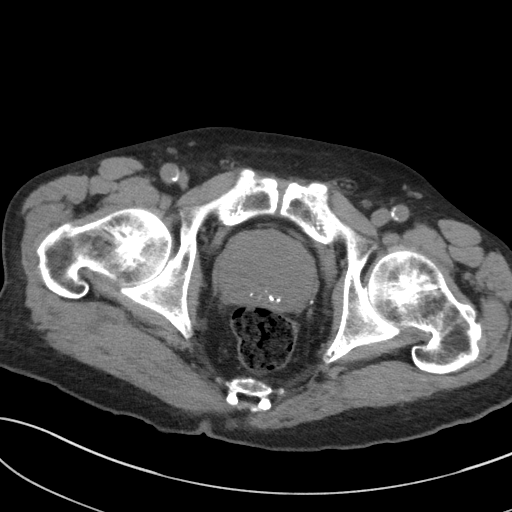
[im 27/91  soft-tissue]
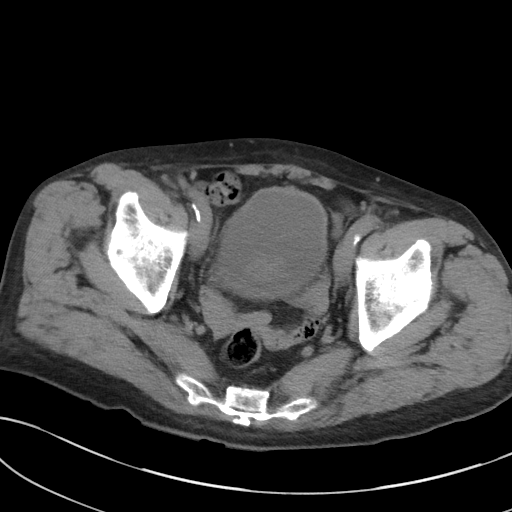
[im 31/91  soft-tissue]
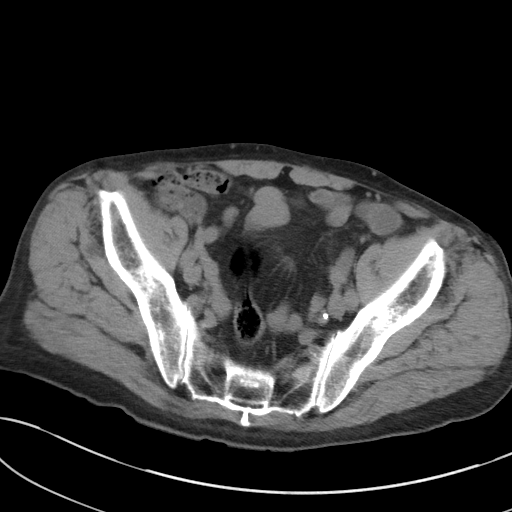
[im 38/91  soft-tissue]
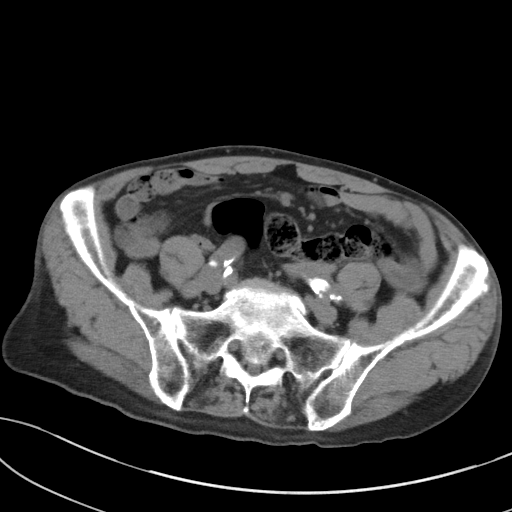
[im 46/91  soft-tissue]
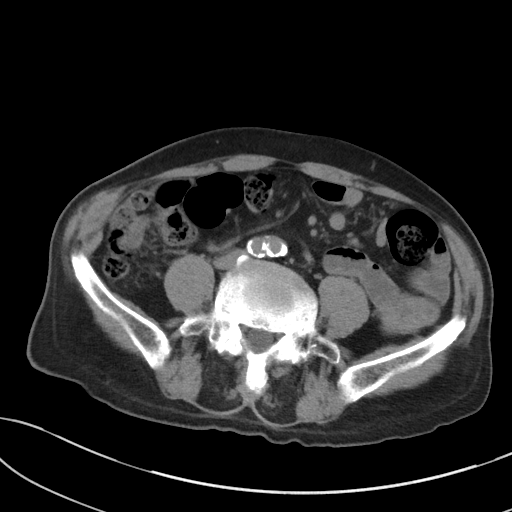
[im 53/91  soft-tissue]
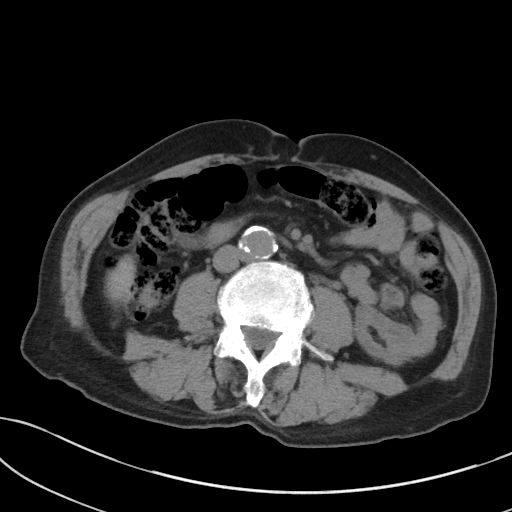
[im 61/91  soft-tissue]
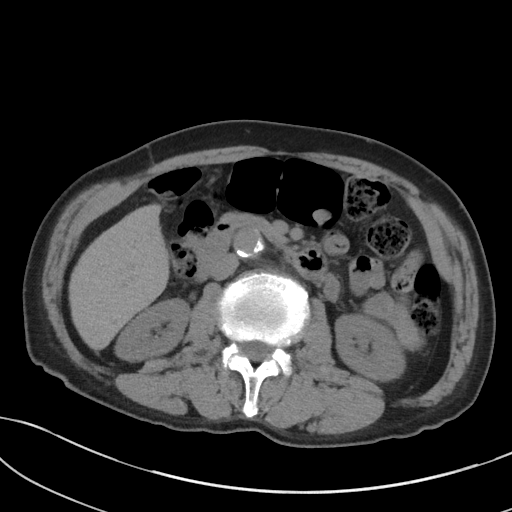
[im 61/91  bone]
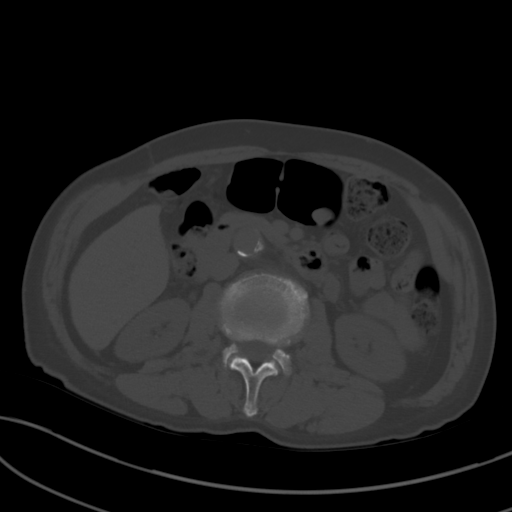
[im 64/91  soft-tissue]
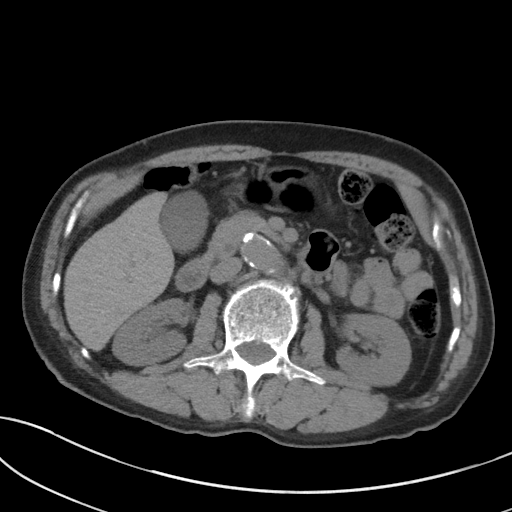
[im 72/91  soft-tissue]
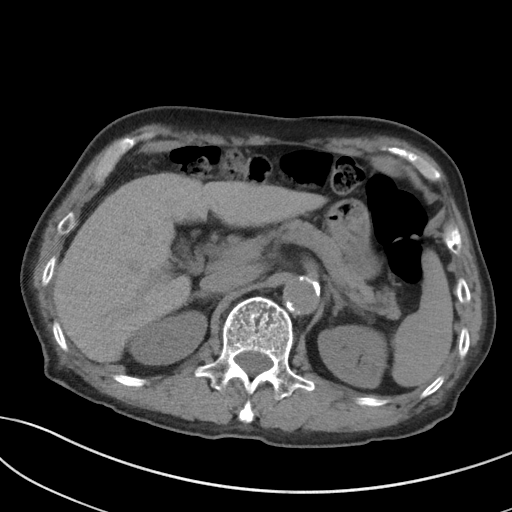
[im 79/91  soft-tissue]
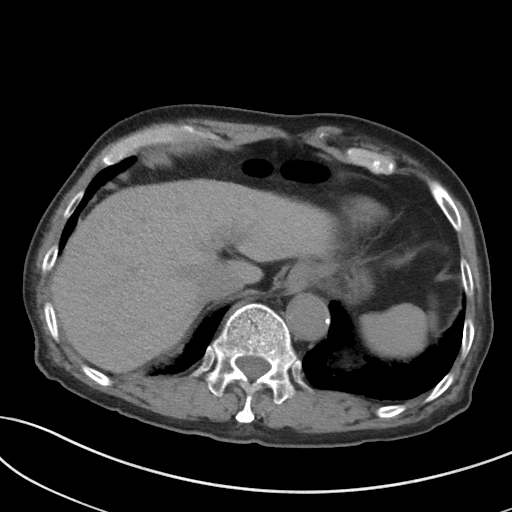
[im 87/91  soft-tissue]
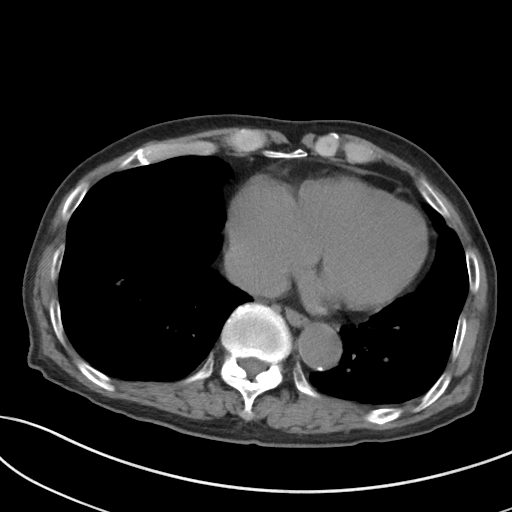

[Series 5: coronal · coronal · 0.75mm/px · 3 of 111 slices shown]
[im 37/111  soft-tissue]
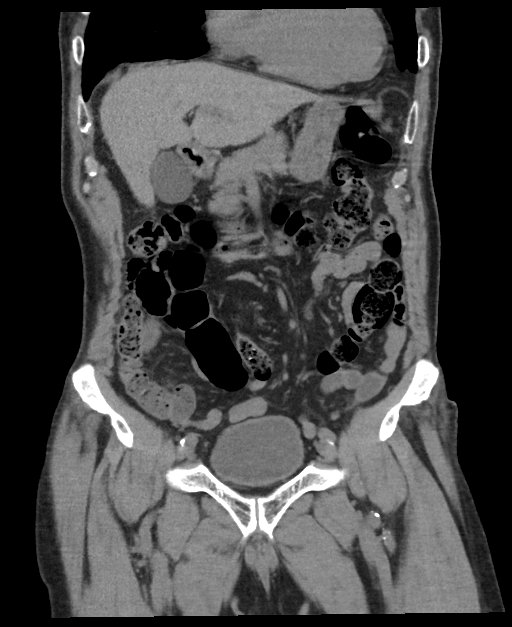
[im 49/111  soft-tissue]
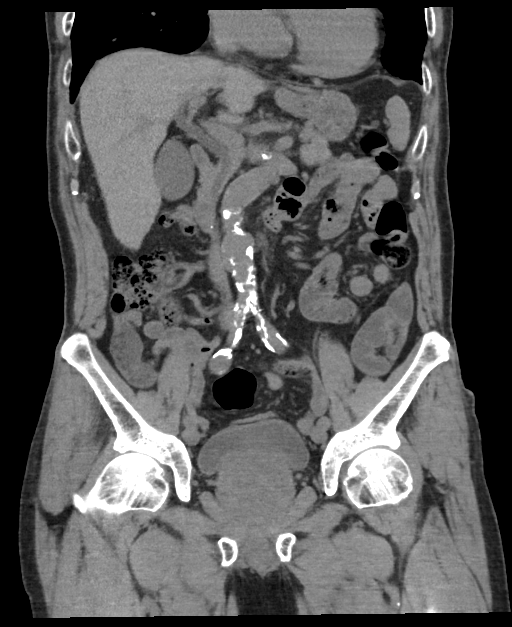
[im 62/111  soft-tissue]
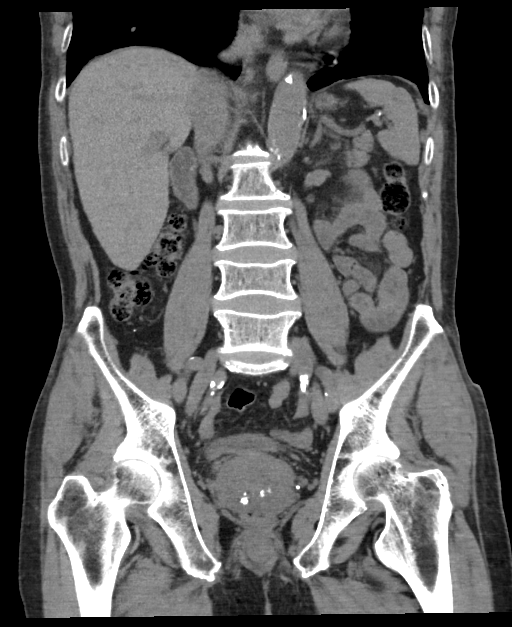

[16 of 46 positions shown; findings below may reference images not displayed]

FINDINGS: Lower chest: No acute abnormality.

Hepatobiliary: No focal liver abnormality is seen. Low-attenuation
gallstone. No intra or extrahepatic biliary ductal dilatation.

Pancreas: Unremarkable. No pancreatic ductal dilatation or
surrounding inflammatory changes.

Spleen: Normal in size without focal abnormality.

Adrenals/Urinary Tract: Adrenal glands are unremarkable. Kidneys are
normal, without renal calculi, focal lesion, or hydronephrosis.
Bladder is unremarkable.

Stomach/Bowel: Stomach is within normal limits. Appendix appears
normal. No evidence of bowel wall thickening, distention, or
inflammatory changes.

Vascular/Lymphatic: 25 mm infrarenal abdominal aortic ectasia.
Severe calcific atherosclerosis of the abdominal aorta. Linear
calcification traversing the lumen of the infrarenal abdominal
aorta, probably calcification of mural thrombus, less likely chronic
dissection. No lymphadenopathy.

Reproductive: Severe prostate enlargement measuring 5.5 x 6.4 x
cm (volume = 130 cm^3) with a convex margin extending into the
floor of the bladder.

Other: No abdominal wall hernia or abnormality. No abdominopelvic
ascites.

Musculoskeletal: No fracture is seen.
IMPRESSION: 1. No acute process identified. No findings of hydronephrosis,
urinary stone disease, or constipation.
2. Cholelithiasis.
3. Severe prostate enlargement, 130 cc.
4. 25 mm infrarenal abdominal aortic ectasia. Ectatic abdominal
aorta at risk for aneurysm development. Recommend followup by
ultrasound in 5 years. This recommendation follows ACR consensus
guidelines: White Paper of the ACR Incidental Findings Committee II
on Vascular Findings. [HOSPITAL] 2281; [DATE].
5. Linear calcification traversing the lumen of a short segment of
the infrarenal abdominal aorta, probably calcified mural thrombus,
less likely chronic dissection.

By: Emilijo Bodenschatz M.D.

## 2022-01-28 DEATH — deceased
